# Patient Record
Sex: Male | Born: 1975 | Race: Black or African American | Hispanic: No | Marital: Married | Smoking: Former smoker
Health system: Southern US, Community
[De-identification: ages and names within clinical notes are randomized; demographics above are authoritative.]

## PROBLEM LIST (undated history)

## (undated) DIAGNOSIS — J309 Allergic rhinitis, unspecified: Secondary | ICD-10-CM

## (undated) HISTORY — PX: VASECTOMY: SHX75

## (undated) HISTORY — DX: Allergic rhinitis, unspecified: J30.9

## (undated) SURGERY — BRONCHOSCOPY, WITH FLUOROSCOPY
Anesthesia: Moderate Sedation

---

## 1998-04-30 ENCOUNTER — Emergency Department (HOSPITAL_COMMUNITY): Admission: EM | Admit: 1998-04-30 | Discharge: 1998-04-30 | Payer: Self-pay | Admitting: Emergency Medicine

## 2002-04-27 ENCOUNTER — Encounter: Payer: Self-pay | Admitting: Emergency Medicine

## 2002-04-27 ENCOUNTER — Emergency Department (HOSPITAL_COMMUNITY): Admission: EM | Admit: 2002-04-27 | Discharge: 2002-04-28 | Payer: Self-pay | Admitting: Emergency Medicine

## 2005-07-31 ENCOUNTER — Emergency Department (HOSPITAL_COMMUNITY): Admission: EM | Admit: 2005-07-31 | Discharge: 2005-07-31 | Payer: Self-pay | Admitting: Emergency Medicine

## 2006-11-27 ENCOUNTER — Encounter: Admission: RE | Admit: 2006-11-27 | Discharge: 2006-11-27 | Payer: Self-pay | Admitting: Family Medicine

## 2013-01-18 ENCOUNTER — Ambulatory Visit: Payer: Self-pay | Admitting: Family Medicine

## 2013-01-18 VITALS — BP 138/75 | HR 96 | Temp 99.0°F | Resp 20 | Ht 68.5 in | Wt 213.6 lb

## 2013-01-18 DIAGNOSIS — Z0000 Encounter for general adult medical examination without abnormal findings: Secondary | ICD-10-CM

## 2013-01-18 DIAGNOSIS — Z0289 Encounter for other administrative examinations: Secondary | ICD-10-CM

## 2013-01-18 NOTE — Progress Notes (Signed)
[facility] [address] [place] [address] [phone]  Date:  01/18/2013   Name:  Dwayne [name]   DOB:  07-30-1975   MRN:  [id number]  PCP:  No primary provider on file.    Chief Complaint: Employment Physical   History of Present Illness:  Dwayne [name] is a 37 y.o. very pleasant male patient who presents with the following:  Here today for a DOT exam.    There are no active problems to display for this patient.   History reviewed. No pertinent past medical history.  Past Surgical History  Procedure Laterality Date  . Vasectomy      2002    History  Substance Use Topics  . Smoking status: Never Smoker   . Smokeless tobacco: Not on file  . Alcohol Use: Yes    History reviewed. No pertinent family history.  Not on File  Medication list has been reviewed and updated.  No current outpatient prescriptions on file prior to visit.   No current facility-administered medications on file prior to visit.    Review of Systems:  As per HPI- otherwise negative.   Physical Examination: Filed Vitals:   01/18/13 1632  BP: 140/76  Pulse: 96  Temp: 99 F (37.2 C)  Resp: 20   Filed Vitals:   01/18/13 1632  Height: 5' 8.5" (1.74 m)  Weight: 213 lb 9.6 oz (96.888 kg)   Body mass index is 32 kg/(m^2). Ideal Body Weight: Weight in (lb) to have BMI = 25: 166.5  GEN: WDWN, NAD, Non-toxic, A & O x 3 HEENT: Atraumatic, Normocephalic. Neck supple. No masses, No LAD. Bilateral TM wnl, oropharynx normal.  PEERL,EOMI.   Ears and Nose: No external deformity. CV: RRR, No M/G/R. No JVD. No thrill. No extra heart sounds. PULM: CTA B, no wheezes, crackles, rhonchi. No retractions. No resp. distress. No accessory muscle use. ABD: S, NT, ND, +BS. No rebound. No HSM. EXTR: No c/c/e NEURO Normal gait.   Normal strength and DTR all extremities  PSYCH: Normally interactive. Conversant. Not depressed or anxious appearing.  Calm demeanor.  No  inguinal hernia  Assessment and Plan: Physical exam  2 year card.   See patient instructions for more details.     Signed Abbe Amsterdam, MD

## 2013-01-18 NOTE — Patient Instructions (Signed)
You qualify for a 2 year DOT card today.  Please follow-up your trace protein and blood in urine with your regular doctor.

## 2014-06-27 ENCOUNTER — Other Ambulatory Visit: Payer: Self-pay | Admitting: Family Medicine

## 2014-06-27 DIAGNOSIS — R945 Abnormal results of liver function studies: Principal | ICD-10-CM

## 2014-06-27 DIAGNOSIS — R7989 Other specified abnormal findings of blood chemistry: Secondary | ICD-10-CM

## 2014-07-04 ENCOUNTER — Ambulatory Visit
Admission: RE | Admit: 2014-07-04 | Discharge: 2014-07-04 | Disposition: A | Discharge status: Home / Self Care | Payer: BLUE CROSS/BLUE SHIELD | Source: Ambulatory Visit | Attending: Family Medicine | Admitting: Family Medicine

## 2014-07-04 ENCOUNTER — Other Ambulatory Visit: Payer: Self-pay

## 2014-07-04 DIAGNOSIS — R7989 Other specified abnormal findings of blood chemistry: Secondary | ICD-10-CM

## 2014-07-04 DIAGNOSIS — R945 Abnormal results of liver function studies: Principal | ICD-10-CM

## 2014-07-11 ENCOUNTER — Other Ambulatory Visit: Payer: Self-pay | Admitting: Family Medicine

## 2014-07-11 DIAGNOSIS — K8689 Other specified diseases of pancreas: Secondary | ICD-10-CM

## 2014-07-14 ENCOUNTER — Ambulatory Visit
Admission: RE | Admit: 2014-07-14 | Discharge: 2014-07-14 | Disposition: A | Discharge status: Home / Self Care | Payer: BLUE CROSS/BLUE SHIELD | Source: Ambulatory Visit | Attending: Family Medicine | Admitting: Family Medicine

## 2014-07-14 DIAGNOSIS — K8689 Other specified diseases of pancreas: Secondary | ICD-10-CM

## 2014-07-14 MED ORDER — IOPAMIDOL (ISOVUE-300) INJECTION 61%
100.0000 mL | Freq: Once | INTRAVENOUS | Status: AC | PRN
  Administered 2014-07-14: 100 mL via INTRAVENOUS

## 2014-07-30 ENCOUNTER — Other Ambulatory Visit (INDEPENDENT_AMBULATORY_CARE_PROVIDER_SITE_OTHER): Payer: BLUE CROSS/BLUE SHIELD

## 2014-07-30 ENCOUNTER — Encounter: Payer: Self-pay | Admitting: Pulmonary Disease

## 2014-07-30 ENCOUNTER — Ambulatory Visit (INDEPENDENT_AMBULATORY_CARE_PROVIDER_SITE_OTHER): Payer: BLUE CROSS/BLUE SHIELD | Admitting: Pulmonary Disease

## 2014-07-30 VITALS — BP 130/82 | HR 72 | Temp 97.2°F | Ht 69.0 in | Wt 175.0 lb

## 2014-07-30 DIAGNOSIS — R162 Hepatomegaly with splenomegaly, not elsewhere classified: Secondary | ICD-10-CM | POA: Diagnosis not present

## 2014-07-30 DIAGNOSIS — R591 Generalized enlarged lymph nodes: Secondary | ICD-10-CM | POA: Diagnosis not present

## 2014-07-30 DIAGNOSIS — D869 Sarcoidosis, unspecified: Secondary | ICD-10-CM

## 2014-07-30 LAB — BASIC METABOLIC PANEL
BUN: 10 mg/dL (ref 6–23)
CHLORIDE: 101 meq/L (ref 96–112)
CO2: 30 mEq/L (ref 19–32)
Calcium: 10.1 mg/dL (ref 8.4–10.5)
Creatinine, Ser: 0.78 mg/dL (ref 0.40–1.50)
GFR: 143.67 mL/min (ref >60.00)
GLUCOSE: 91 mg/dL (ref 70–99)
POTASSIUM: 3.8 meq/L (ref 3.5–5.1)
SODIUM: 137 meq/L (ref 135–145)

## 2014-07-30 NOTE — Progress Notes (Addendum)
Subjective:     Patient ID: Dwayne [name], male   DOB: [birth year], 39 y.o.   MRN: [id number]  HPI 39 y/o BM, referred by Dr. 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  [name] has enjoyed excellent general medical health & his only medication is Benedryl prn;  About 84mo ago he noted itching & decrease in appetite assoc w/ nausea & a 50 lb wt loss ?etiology; he was treated w/ Pred & Atarax & the itching improved; Labs showed elev LFTs w/ AlkPhos=636, GOT=68, GPT=62, Sed=55 (other Chems/ CBC/ TSH all wnl); repeat labs 2/16 were similar (plus Hep screen waqs neg) & he had AbdSonar 3/16 showing no gallstones, norm CBD, cystic lesion in pancreatic head, nonobstructing stone in right kidney; f/u CT Abd was surprising- no pancreatic lesion seen but he had mod hepatomegaly/ steatosis/ & heterogeneous enhancement (due to liver lesions), innumerable low-density splenic lesions, extensive abdominal adenopathy, bilat punctate renal collecting system calculi, and visualize lung bases w/ L>R nodularity & fibrosis & adenopathy seen...  PMHx, FamHx, SocHx are non-contributory; Married, one son, and he is a smoker- <1/2ppd, <10 pack-year history overall; and he denies cough, sput, hemoptysis, f/c/s, CP, etc...  EXAM reveals Afeb, VSS, O2sat=99% on RA at rest;  HEENT- neg, mallampati 2;  Chest- clear w/o w/r/r;  Heart- RR w/o m/r/g;  Ext- neg w/o c/c/e; no palp adenopathy...  CXR>  (he forgot to go to [facility] for this baseline film)  High- Res CT Chest w/ contrast>  DONE 08/04/14> bilat hilar & mediastinal adenopathy including 1.8cm subcarinal node; diffuse peribronchovasc nodularity w/ assoc mild consolid in LLL; heterogeneous liver and splenic nodularity...  Full PFTs w/ lung volumes and DLCO>  DONE 08/04/14> FVC=3.56 (89%), FEV1=2.87 (87%), %1sec=81, mid-flows normal at 86% predicted;  TLC=4.03 (63%), RV=?0.39 (24%)?;  DLCO=?130% predicted?   Additional  Labs w/ ACE level> Chems- wnl;  ACE>100 (8-52)...      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 Once we get to see these studies we will contact the pt regarding the best approach to Bx for tissue diagnosis, only then can we formulate a treatment plan...  ADDENDUM>>  All data collected is concordant for the diagnosis of Sarcoidosis- CT Chest shows BHA, mediastinal adenopathy, and parenchymal nodularity- making it likely that we can obtain a diagnositic Bx from lung tissue &/or mediatinal LN => we will arrange for Bronchoscopy w/ Bx...     Past Medical History  Diagnosis Date  . Allergic rhinitis    Past Surgical History  Procedure Laterality Date  . Vasectomy      2002    Outpatient Encounter Prescriptions as of 07/30/2014  Medication Sig  . diphenhydrAMINE (SOMINEX) 25 MG tablet Take 25 mg by mouth as needed for sleep.   No Known Allergies   No family history on file.> FamHx is neg for sarcoid, cancer, or hx lung dis...  History   Social History  . Marital Status: Married    Spouse Name: N/A  . Number of Children: N/A  . Years of Education: N/A   Occupational History  . Not on file.  Social History Main Topics  . Smoking status: Light Tobacco Smoker -- 0.30 packs/day for 10 years    Types: Cigarettes  . Smokeless tobacco: Not on file  . Alcohol Use: 0.0 oz/week    0 Standard drinks or equivalent per week  . Drug Use: No  . Sexual Activity: Not on file   Other Topics Concern  . Not on file   Social History Narrative    Current Medications, Allergies, Past Medical History, Past Surgical History, Family History, and Social History were reviewed  in [employer] record.   Review of Systems         All symptoms NEG except when BOLDED >>  Constitutional:  Denies F/C/S, decreased appetite & unexpected 50# weight loss. HEENT:  No HA, visual changes, earache, nasal symptoms, sore throat, hoarseness. Resp:  No cough, sputum, hemoptysis; no SOB, tightness, wheezing. Cardio:  No CP, palpit, DOE, orthopnea, edema. GI:  +Nausea w/o V/D/C or blood in stool; no reflux, abd pain, distention, or gas. GU:  No dysuria, freq, urgency, hematuria, or flank pain. MS:  Denies joint pain, swelling, tenderness, or decr ROM; no neck pain, back pain, etc. Neuro:  No tremors, seizures, dizziness, syncope, weakness, numbness, gait abn. Skin:  No suspicious lesions or skin rash, prev itching resolved... Heme:  +adenopathy, no bruising/ bleeding. Psyche: Denies confusion, sleep disturbance, hallucinations, anxiety, depression.   Objective:   Physical Exam    Vital Signs:  Reviewed...  General:  WD, WN, 39 y/o BM in NAD; alert & oriented; pleasant & cooperative... HEENT:  [place]/AT; Conjunctiva- pink, Sclera- nonicteric, EOM-wnl, PERRLA, EACs-clear, TMs-wnl; NOSE-clear; THROAT-clear & wnl. Neck:  Supple w/ full ROM; no JVD; normal carotid impulses w/o bruits; no thyromegaly or nodules palpated; no lymphadenopathy. Chest:  Clear to P & A; without wheezes, rales, or rhonchi heard. Heart:  Regular Rhythm; norm S1 & S2 without murmurs, rubs, or gallops detected. Abdomen:  Soft w/ min epig tenderness- no guarding or rebound; normal bowel sounds; liver edge palpated, no masses felt... Ext:  Normal ROM; without deformities or arthritic changes; no varicose veins, venous insuffic, or edema;  Pulses intact w/o bruits. Neuro:  CNs II-XII intact; motor testing normal; sensory testing normal; gait normal & balance OK. Derm:  No lesions noted; no rash etc. Lymph:  No cervical, supraclavicular, axillary, or inguinal adenopathy  palpated.   Assessment:      R/O Sarcoidosis to explain all his findings>> Chest evaluation pending but looks like adenopathy & reticulonodular changes in lung bases L>R... Abn LFTs (AlkPhos> GOT/GPT) w/ mod hepatomegaly and heterogenous enhancement (due to steatosis & numerous lesions)... Splenic enlargement too w/ innumerable low density lesions throughout... Extensive intra-abdominal adenopathy...  The big picture makes a compelling story for SARCOIDOSIS w/ granulomatous inflammation in liver, spleen, intra-abd nodes; and prob the chest as well- based on prox extent of the Abd CT but he appears to be asymptomatic in regards to any respiratory involvement;  We discussed the need for a CXR & hi-res CTChest, along w/ baseline PFTs and an ACE level... Once we get to see these studies we will contact the pt regarding the best approach to Bx for tissue diagnosis, only then can we formulate a treatment plan...     Plan:     Patient's Medications  New Prescriptions   No medications on file  Previous Medications   DIPHENHYDRAMINE (SOMINEX) 25 MG TABLET    Take 25 mg by mouth as needed for sleep.  Modified Medications  No medications on file  Discontinued Medications   No medications on file

## 2014-07-30 NOTE — Patient Instructions (Signed)
Dwayne [name], it was nice meeting you today...  We reviewed your data to date and the probability that this all represents the condition we call Sarcoidosis.Marland KitchenMarland Kitchen  I have suggested that we proceed w/ a CT scan of your chest and a pulmonary function test...    Along with the sarcoid blood test called the ACE level...    We can then decide on the best approach for biopsy (this is required to officially diagnose Sarcoidosis)...  I will call w/ the test results & we can decide on this approach at that time...  Call for any questions...  We will plan a follow up visit after the biopsy is done.Marland KitchenMarland Kitchen

## 2014-07-31 LAB — ANGIOTENSIN CONVERTING ENZYME: Angiotensin-Converting Enzyme: >100 U/L — ABNORMAL HIGH (ref 8–52)

## 2014-08-04 ENCOUNTER — Ambulatory Visit (INDEPENDENT_AMBULATORY_CARE_PROVIDER_SITE_OTHER)
Admission: RE | Admit: 2014-08-04 | Discharge: 2014-08-04 | Disposition: A | Discharge status: Home / Self Care | Payer: BLUE CROSS/BLUE SHIELD | Source: Ambulatory Visit | Attending: Pulmonary Disease | Admitting: Pulmonary Disease

## 2014-08-04 ENCOUNTER — Ambulatory Visit (HOSPITAL_COMMUNITY)
Admission: RE | Admit: 2014-08-04 | Discharge: 2014-08-04 | Disposition: A | Discharge status: Home / Self Care | Payer: BLUE CROSS/BLUE SHIELD | Source: Ambulatory Visit | Attending: Pulmonary Disease | Admitting: Pulmonary Disease

## 2014-08-04 DIAGNOSIS — D869 Sarcoidosis, unspecified: Secondary | ICD-10-CM | POA: Diagnosis present

## 2014-08-04 LAB — PULMONARY FUNCTION TEST
DL/VA % pred: 185 %
DL/VA: 8.3 ml/min/mmHg/L
DLCO UNC % PRED: 130 %
DLCO UNC: 37.03 ml/min/mmHg
FEF 25-75 Post: 2.91 L/sec
FEF 25-75 Pre: 3.1 L/sec
FEF2575-%Change-Post: -6 %
FEF2575-%PRED-PRE: 86 %
FEF2575-%Pred-Post: 81 %
FEV1-%CHANGE-POST: 0 %
FEV1-%PRED-POST: 86 %
FEV1-%Pred-Pre: 87 %
FEV1-PRE: 2.87 L
FEV1-Post: 2.85 L
FEV1FVC-%Change-Post: 1 %
FEV1FVC-%PRED-PRE: 98 %
FEV6-%Change-Post: -2 %
FEV6-%PRED-POST: 88 %
FEV6-%Pred-Pre: 91 %
FEV6-Post: 3.47 L
FEV6-Pre: 3.56 L
FEV6FVC-%Change-Post: 0 %
FEV6FVC-%Pred-Post: 102 %
FEV6FVC-%Pred-Pre: 102 %
FVC-%CHANGE-POST: -2 %
FVC-%PRED-POST: 86 %
FVC-%PRED-PRE: 89 %
FVC-Post: 3.47 L
FVC-Pre: 3.56 L
PRE FEV1/FVC RATIO: 81 %
PRE FEV6/FVC RATIO: 100 %
Post FEV1/FVC ratio: 82 %
Post FEV6/FVC ratio: 100 %
RV % PRED: 24 %
RV: 0.39 L
TLC % pred: 63 %
TLC: 4.03 L

## 2014-08-04 MED ORDER — ALBUTEROL SULFATE (2.5 MG/3ML) 0.083% IN NEBU
2.5000 mg | INHALATION_SOLUTION | Freq: Once | RESPIRATORY_TRACT | Status: AC
  Administered 2014-08-04: 2.5 mg via RESPIRATORY_TRACT

## 2014-08-06 ENCOUNTER — Telehealth: Payer: Self-pay | Admitting: Pulmonary Disease

## 2014-08-07 NOTE — Telephone Encounter (Signed)
SN spoke with pt about results and scheduling a broncoscopy with one of our providers. Per SN, pt voiced understanding. Nothing further is needed at this time.

## 2014-08-07 NOTE — Telephone Encounter (Signed)
Message printed and placed on  SN cart for review  

## 2014-08-07 NOTE — Telephone Encounter (Signed)
[phone], pt wife calling back states SN called her this morning and left a message to have her call back to discuss scheduling bronch/biopsy

## 2014-08-08 ENCOUNTER — Telehealth: Payer: Self-pay | Admitting: Pulmonary Disease

## 2014-08-08 NOTE — Telephone Encounter (Signed)
Bronch scheduled for Wednesday, May 4 at 8:00 at [facility] with BQ performing the bronch.  Spoke with pt's wife who states this time works well for pt.  Forwarding to BQ and SN to make them aware.  Nothing further needed.

## 2014-08-13 ENCOUNTER — Inpatient Hospital Stay (HOSPITAL_COMMUNITY)
Admission: RE | Admit: 2014-08-13 | Discharge: 2014-08-13 | Disposition: A | Discharge status: Home / Self Care | Payer: BLUE CROSS/BLUE SHIELD | Source: Ambulatory Visit

## 2014-08-13 ENCOUNTER — Ambulatory Visit (HOSPITAL_COMMUNITY)
Admission: RE | Admit: 2014-08-13 | Discharge: 2014-08-13 | Disposition: A | Discharge status: Home / Self Care | Payer: BLUE CROSS/BLUE SHIELD | Source: Ambulatory Visit | Attending: Pulmonary Disease | Admitting: Pulmonary Disease

## 2014-08-13 ENCOUNTER — Ambulatory Visit (HOSPITAL_COMMUNITY): Payer: BLUE CROSS/BLUE SHIELD

## 2014-08-13 ENCOUNTER — Encounter (HOSPITAL_COMMUNITY)
Admission: RE | Disposition: A | Discharge status: Home / Self Care | Payer: Self-pay | Source: Ambulatory Visit | Attending: Pulmonary Disease

## 2014-08-13 DIAGNOSIS — F17210 Nicotine dependence, cigarettes, uncomplicated: Secondary | ICD-10-CM | POA: Diagnosis not present

## 2014-08-13 DIAGNOSIS — R938 Abnormal findings on diagnostic imaging of other specified body structures: Secondary | ICD-10-CM | POA: Diagnosis not present

## 2014-08-13 DIAGNOSIS — D869 Sarcoidosis, unspecified: Secondary | ICD-10-CM | POA: Insufficient documentation

## 2014-08-13 DIAGNOSIS — R9389 Abnormal findings on diagnostic imaging of other specified body structures: Secondary | ICD-10-CM | POA: Insufficient documentation

## 2014-08-13 DIAGNOSIS — Z9889 Other specified postprocedural states: Secondary | ICD-10-CM

## 2014-08-13 HISTORY — PX: VIDEO BRONCHOSCOPY: SHX5072

## 2014-08-13 LAB — CBC
HCT: 44.1 % (ref 39.0–52.0)
Hemoglobin: 14.8 g/dL (ref 13.0–17.0)
MCH: 29.5 pg (ref 26.0–34.0)
MCHC: 33.6 g/dL (ref 30.0–36.0)
MCV: 87.8 fL (ref 78.0–100.0)
Platelets: 306 10*3/uL (ref 150–400)
RBC: 5.02 MIL/uL (ref 4.22–5.81)
RDW: 13.7 % (ref 11.5–15.5)
WBC: 5.2 10*3/uL (ref 4.0–10.5)

## 2014-08-13 SURGERY — BRONCHOSCOPY, WITH FLUOROSCOPY
Anesthesia: Moderate Sedation | Laterality: Bilateral

## 2014-08-13 MED ORDER — MIDAZOLAM HCL 5 MG/ML IJ SOLN
INTRAMUSCULAR | Status: AC
  Filled 2014-08-13: qty 2

## 2014-08-13 MED ORDER — BUTAMBEN-TETRACAINE-BENZOCAINE 2-2-14 % EX AERO: 1.0000 | INHALATION_SPRAY | Freq: Once | CUTANEOUS | Status: DC

## 2014-08-13 MED ORDER — PHENYLEPHRINE HCL 0.25 % NA SOLN
NASAL | Status: DC | PRN
  Administered 2014-08-13: 2 via NASAL

## 2014-08-13 MED ORDER — FENTANYL CITRATE (PF) 100 MCG/2ML IJ SOLN
INTRAMUSCULAR | Status: AC
  Filled 2014-08-13: qty 4

## 2014-08-13 MED ORDER — LIDOCAINE HCL 2 % EX GEL: 1.0000 "application " | Freq: Once | CUTANEOUS | Status: DC

## 2014-08-13 MED ORDER — MIDAZOLAM HCL 5 MG/ML IJ SOLN
INTRAMUSCULAR | Status: DC | PRN
  Administered 2014-08-13: 2 mg via INTRAVENOUS
  Administered 2014-08-13 (×2): 1 mg via INTRAVENOUS

## 2014-08-13 MED ORDER — FENTANYL CITRATE (PF) 100 MCG/2ML IJ SOLN
25.0000 ug | Freq: Once | INTRAMUSCULAR | Status: AC
  Administered 2014-08-13: 25 ug via INTRAVENOUS

## 2014-08-13 MED ORDER — LIDOCAINE HCL (PF) 1 % IJ SOLN
INTRAMUSCULAR | Status: DC | PRN
  Administered 2014-08-13: 6 mL

## 2014-08-13 MED ORDER — MIDAZOLAM HCL 2 MG/2ML IJ SOLN: 4.0000 mg | Freq: Once | INTRAMUSCULAR | Status: DC

## 2014-08-13 MED ORDER — FENTANYL CITRATE (PF) 100 MCG/2ML IJ SOLN
100.0000 ug | Freq: Once | INTRAMUSCULAR | Status: AC
  Administered 2014-08-13: 50 ug via INTRAVENOUS

## 2014-08-13 MED ORDER — PHENYLEPHRINE HCL 0.25 % NA SOLN: 1.0000 | Freq: Four times a day (QID) | NASAL | Status: DC | PRN

## 2014-08-13 MED ORDER — LIDOCAINE HCL 2 % EX GEL
CUTANEOUS | Status: DC | PRN
  Administered 2014-08-13: 1

## 2014-08-13 NOTE — H&P (Signed)
  [facility]  HPI: Mr. Melucci presented with a significant weight loss this year and was found to have an abnormal CT chest with mediastinal lymphadenopathy diffuse nodularity.  He also has a high serum ACE level.  He was referred by my partner for a bronchoscopy. He is feeling well today  Past Medical History  Diagnosis Date  . Allergic rhinitis      No family history on file.   History   Social History  . Marital Status: Married    Spouse Name: N/A  . Number of Children: N/A  . Years of Education: N/A   Occupational History  . Not on file.   Social History Main Topics  . Smoking status: Light Tobacco Smoker -- 0.30 packs/day for 10 years    Types: Cigarettes  . Smokeless tobacco: Not on file  . Alcohol Use: 0.0 oz/week    0 Standard drinks or equivalent per week  . Drug Use: No  . Sexual Activity: Not on file   Other Topics Concern  . Not on file   Social History Narrative     No Known Allergies   @[email or web address] @  Filed Vitals:   08/13/14 0730 08/13/14 0735 08/13/14 0740 08/13/14 0745  BP: 135/93 139/82 143/89 148/85  Pulse: 70 65 68 69  Temp:      TempSrc:      Resp: 13 11 11 11   SpO2: 99% 98% 99% 98%   Gen: well appearing, no acute distress HENT: NCAT, OP clear, neck supple without masses Eyes: EOMi, sclera anicteric Lymph: no cervical lymphadenopathy PULM: CTA B CV: RRR, no mgr, no JVD GI: BS+, soft, nontender, no hsm Derm: no rash or skin breakdown MSK: normal bulk and tone Neuro: A&Ox4, MAEW Psyche: normal mood and affect  CT images personally reviewed with significant nodularity throughout both lungs, particularly on the left lung  Impression: 1) Abnormal CT chest which is likely representative of sarcoidosis, needs tissue diagnosis.  Plan 1) CBC stat to check PLT 2) bronchoscopy with fluoro today for biopsy  Roselie Awkward, MD [place] [facility] Pager: [phone] Cell: [phone] If no response, call [phone]

## 2014-08-13 NOTE — Discharge Instructions (Signed)
Flexible Bronchoscopy, Care After These instructions give you information on caring for yourself after your procedure. Your doctor may also give you more specific instructions. Call your doctor if you have any problems or questions after your procedure. HOME CARE  Do not eat or drink anything for 2 hours after your procedure. If you try to eat or drink before the medicine wears off, food or drink could go into your lungs. You could also burn yourself.  After 2 hours have passed and when you can cough and gag normally, you may eat soft food and drink liquids slowly.  The day after the test, you may eat your normal diet.  You may do your normal activities.  Keep all doctor visits. GET HELP RIGHT AWAY IF:  You get more and more short of breath.  You get light-headed.  You feel like you are going to pass out (faint).  You have chest pain.  You have new problems that worry you.  You cough up more than a little blood.  You cough up more blood than before. MAKE SURE YOU:  Understand these instructions.  Will watch your condition.  Will get help right away if you are not doing well or get worse. DO NOT EAT OR DRINK UNTIL 10:30 AM 08/13/14  Document Released: 01/23/2009 Document Revised: 04/02/2013 Document Reviewed: 11/30/2012 [facility] Patient Information 2015 [place], [place]. This information is not intended to replace advice given to you by your health care provider. Make sure you discuss any questions you have with your health care provider.

## 2014-08-13 NOTE — Progress Notes (Signed)
Video Bronchoscopy Performed  Intervention Bronchial Washing done Intervention Biopsy done  Pt tolerated well

## 2014-08-13 NOTE — Op Note (Signed)
PCCM Bronchoscopy Procedure Note  The patient was informed of the risks (including but not limited to bleeding, infection, respiratory failure, lung injury, tooth/oral injury) and benefits of the procedure and gave consent, see chart.  Indication: Abnormal CT chest, likely sarcoid  Location: [facility]  Condition pre procedure: Stable  Medications for procedure: Versed 4mg  IV, Fentanyl 168mcg IV, topical lidocaine 1% 9cc  Procedure description: The bronchoscope was introduced through the nose and passed to the bilateral lungs to the level of the subsegmental bronchi throughout the tracheobronchial tree.  Airway exam revealed normal appearing and functioning larynx, normal trachea, sharp carina.  The tracheobronchial trees bilaterally were normal with the exception of a scant amount of white thin mucus.  No airway lesions or mass.  Procedures performed:  1) Transbronchial biopsy left lower lobe lateral and anterior basal segments> 6 total biopsies 2) Endobronchial biopsy left lung subcarina 3) BAL Lingula   Specimens sent:  1) Surgical pathology left lower lobe transbronchial biopsy 2) Surgical pathology left subcarina endobronchial biopsy 3) Routine cytology BAL 4) Culture (Bacterial, fungal, AFB) BAL  Condition post procedure: Stable  EBL: < 07EM  Complications: none immediate  A chest X-ray was ordered at the completion of the procedure  Post procedure diagnosis: normal bronchoscopy, abnormal CT chest suspicious for sarcoidosis  Roselie Awkward, MD [place] [facility] Pager: [phone] Cell: [phone] If no response, call [phone]

## 2014-08-14 ENCOUNTER — Telehealth: Payer: Self-pay | Admitting: Pulmonary Disease

## 2014-08-14 ENCOUNTER — Encounter (HOSPITAL_COMMUNITY): Payer: Self-pay | Admitting: Pulmonary Disease

## 2014-08-14 NOTE — Telephone Encounter (Signed)
Per 08/14/14 phone note: Dwayne Doom, MD at 08/14/2014 1:44 PM     Status: Signed       Expand All Collapse All   Attempted to call patient to discuss results of bronchoscopy, had to leave a message. I asked him to call back and let us know when he can be reached.      --   Called # above provided for pt spouse [name]. She reports we can call her with results anytime as she is a Marine scientist and would understand per spouse.  Pt can be reached at [phone] anytime. Please advise BQ thanks

## 2014-08-14 NOTE — Telephone Encounter (Signed)
Attempted to call patient to discuss results of bronchoscopy, had to leave a message.  I asked him to call back and let us know when he can be reached.

## 2014-08-15 ENCOUNTER — Other Ambulatory Visit: Payer: Self-pay

## 2014-08-15 LAB — CULTURE, RESPIRATORY: SPECIAL REQUESTS: NORMAL

## 2014-08-15 LAB — CULTURE, RESPIRATORY W GRAM STAIN

## 2014-08-15 NOTE — Telephone Encounter (Signed)
I spoke with Dwayne [name] and explained the results were negative and we will wait for the results of the cultures which can take up to 6 weeks.

## 2014-08-19 ENCOUNTER — Other Ambulatory Visit: Payer: Self-pay

## 2014-08-19 ENCOUNTER — Telehealth: Payer: Self-pay | Admitting: Pulmonary Disease

## 2014-08-19 DIAGNOSIS — R9389 Abnormal findings on diagnostic imaging of other specified body structures: Secondary | ICD-10-CM

## 2014-08-19 NOTE — Telephone Encounter (Signed)
Called and spoke to pt's wife, [name]. [name] stated on 5/6 BQ spoke with pt and not her. Informed her of the results per BQ. [name] verbalized understanding and denied any further questions or concerns at this time.    Juanito Doom, MD at 08/15/2014 7:23 AM     Status: Signed       Expand All Collapse All   I spoke with [name] and explained the results were negative and we will wait for the results of the cultures which can take up to 6 weeks.

## 2014-08-21 ENCOUNTER — Telehealth: Payer: Self-pay

## 2014-08-21 ENCOUNTER — Encounter (HOSPITAL_COMMUNITY): Payer: Self-pay | Admitting: Pulmonary Disease

## 2014-08-21 NOTE — Telephone Encounter (Signed)
-----   Message from Juanito Doom, MD sent at 08/21/2014  5:18 AM EDT ----- Actually its not a great day for me because I have to take my son to a Dr's appointment.  Caryl Pina, can you double book him in clinic with me that week so he and I can go over his biopsy results?  Thanks Ruby Cola ----- Message -----    From: Joellen Jersey    Sent: 08/20/2014  11:02 AM      To: Juanito Doom, MD  [email or web address]  09/01/14@7 :30am is this ok if so i will call the pt thanks libby

## 2014-08-21 NOTE — Telephone Encounter (Signed)
LMTCB X1 for pt.  Pt needs to be double booked on Thursday, May 26th with BQ if possible.  Please let pt know that BQ wishes to discuss biopsy results and further course of treatment in person.

## 2014-08-25 NOTE — Telephone Encounter (Signed)
Pt double booked for next Thursday at [facility] request.  Nothing further needed at this time.

## 2014-09-01 ENCOUNTER — Encounter (HOSPITAL_COMMUNITY): Admission: RE | Payer: Self-pay | Source: Ambulatory Visit

## 2014-09-01 ENCOUNTER — Encounter (HOSPITAL_COMMUNITY): Payer: BLUE CROSS/BLUE SHIELD

## 2014-09-01 ENCOUNTER — Ambulatory Visit (HOSPITAL_COMMUNITY)
Admission: RE | Admit: 2014-09-01 | Payer: BLUE CROSS/BLUE SHIELD | Source: Ambulatory Visit | Admitting: Pulmonary Disease

## 2014-09-01 SURGERY — ENDOBRONCHIAL ULTRASOUND (EBUS)
Anesthesia: General | Laterality: Bilateral

## 2014-09-04 ENCOUNTER — Ambulatory Visit (INDEPENDENT_AMBULATORY_CARE_PROVIDER_SITE_OTHER): Payer: BLUE CROSS/BLUE SHIELD | Admitting: Pulmonary Disease

## 2014-09-04 ENCOUNTER — Encounter: Payer: Self-pay | Admitting: Pulmonary Disease

## 2014-09-04 VITALS — BP 118/72 | HR 69 | Ht 69.0 in | Wt 179.0 lb

## 2014-09-04 DIAGNOSIS — R9389 Abnormal findings on diagnostic imaging of other specified body structures: Secondary | ICD-10-CM

## 2014-09-04 DIAGNOSIS — R938 Abnormal findings on diagnostic imaging of other specified body structures: Secondary | ICD-10-CM | POA: Diagnosis not present

## 2014-09-04 NOTE — Addendum Note (Signed)
Addended by: Parke Poisson E on: 09/04/2014 02:07 PM   Modules accepted: Orders

## 2014-09-04 NOTE — Assessment & Plan Note (Signed)
I agree with my colleague that the most likely diagnosis here is sarcoidosis. However, samples from his bronchoscopy did not show evidence of granulomatous inflammation on 2 separate biopsies. We have reviewed the CT images today in clinic and I discussed the options of performing an endobronchial ultrasound-guided needle aspiration of the lymph nodes or performing a mediastinoscopy which would be the gold standard for diagnosis. Because he is our to gone through one endoscopy he would prefer to go for the definitive procedure to get an answer. I agree with this plan.  Plan: We will make arrangements for him to see thoracic surgery.  All questions were answered for both he and his wife in a greater than 25 minute visit today.

## 2014-09-04 NOTE — Patient Instructions (Addendum)
We will cancel the procedure on June 6 and will refer you to thoracic surgery for a mediastinal lymph node biopsy Follow-up 4 weeks with Dr. Lenna Gilford

## 2014-09-04 NOTE — Progress Notes (Signed)
   Subjective:    Patient ID: Dwayne [name], male    DOB: 1976/02/24, 39 y.o.   MRN: [id number]  HPI Chief Complaint  Patient presents with  . Follow-up    Pt has no complaints today.  here to review bronch and discuss possible biopsy.      This is a very pleasant 39 year old male who comes to my clinic today to follow-up after recent bronchoscopy. Briefly. He was seen by my partner a few weeks back after a CT scan showed evidence of diffuse nodular opacities in his lungs as well as some hilar lymphadenopathy. He was also noted to have splenic lesions as well as some liver lesions. He underwent a bronchoscopy with transbronchial biopsies of the left upper lobe and left lower lobe as well as BAL for culture. This showed a 9 tissue without evidence of malignancy or atypia. He says that at the time that he was seen by my partner he was having symptoms of chest tightness, shortness of breath and cough. All of the symptoms have Society that he is now feeling well.  Past Medical History  Diagnosis Date  . Allergic rhinitis       Review of Systems  Constitutional: Negative for fever, chills and fatigue.  HENT: Negative for postnasal drip, rhinorrhea and sinus pressure.   Respiratory: Negative for cough, shortness of breath and wheezing.   Cardiovascular: Negative for chest pain, palpitations and leg swelling.       Objective:   Physical Exam Filed Vitals:   09/04/14 1326  BP: 118/72  Pulse: 69  Height: 5\' 9"  (1.753 m)  Weight: 179 lb (81.194 kg)  SpO2: 99%   Gen: well appearing HENT: OP clear, TM's clear, neck supple PULM: CTA B, normal percussion CV: RRR, no mgr, trace edema GI: BS+, soft, nontender Derm: no cyanosis or rash Psyche: normal mood and affect  CT images from his CT abdomen as well as a CT chest were reviewed today in clinic, there is diffuse micronodular changes as well as some hilar adenopathy Previous consult note from my partner reviewed       Assessment  & Plan:  Abnormal CT scan, chest I agree with my colleague that the most likely diagnosis here is sarcoidosis. However, samples from his bronchoscopy did not show evidence of granulomatous inflammation on 2 separate biopsies. We have reviewed the CT images today in clinic and I discussed the options of performing an endobronchial ultrasound-guided needle aspiration of the lymph nodes or performing a mediastinoscopy which would be the gold standard for diagnosis. Because he is our to gone through one endoscopy he would prefer to go for the definitive procedure to get an answer. I agree with this plan.  Plan: We will make arrangements for him to see thoracic surgery.  All questions were answered for both he and his wife in a greater than 25 minute visit today.    No current outpatient prescriptions on file.

## 2014-09-10 LAB — FUNGUS CULTURE W SMEAR
FUNGAL SMEAR: NONE SEEN
Special Requests: NORMAL

## 2014-09-15 ENCOUNTER — Encounter (HOSPITAL_COMMUNITY): Payer: BLUE CROSS/BLUE SHIELD

## 2014-09-15 ENCOUNTER — Encounter (HOSPITAL_COMMUNITY): Admission: RE | Payer: Self-pay | Source: Ambulatory Visit

## 2014-09-15 ENCOUNTER — Ambulatory Visit (HOSPITAL_COMMUNITY)
Admission: RE | Admit: 2014-09-15 | Payer: BLUE CROSS/BLUE SHIELD | Source: Ambulatory Visit | Admitting: Pulmonary Disease

## 2014-09-15 SURGERY — ENDOBRONCHIAL ULTRASOUND (EBUS)
Anesthesia: General | Laterality: Bilateral

## 2014-09-17 ENCOUNTER — Encounter: Payer: BLUE CROSS/BLUE SHIELD | Admitting: Surgery

## 2014-09-19 ENCOUNTER — Institutional Professional Consult (permissible substitution) (INDEPENDENT_AMBULATORY_CARE_PROVIDER_SITE_OTHER): Payer: BLUE CROSS/BLUE SHIELD | Admitting: Surgery

## 2014-09-19 ENCOUNTER — Encounter: Payer: Self-pay | Admitting: Surgery

## 2014-09-19 VITALS — BP 124/75 | HR 84 | Resp 20 | Ht 69.0 in | Wt 175.0 lb

## 2014-09-19 DIAGNOSIS — R599 Enlarged lymph nodes, unspecified: Secondary | ICD-10-CM

## 2014-09-19 DIAGNOSIS — R590 Localized enlarged lymph nodes: Secondary | ICD-10-CM

## 2014-09-22 ENCOUNTER — Encounter: Payer: Self-pay | Admitting: Surgery

## 2014-09-22 ENCOUNTER — Other Ambulatory Visit: Payer: Self-pay | Admitting: *Deleted

## 2014-09-22 DIAGNOSIS — R590 Localized enlarged lymph nodes: Secondary | ICD-10-CM

## 2014-09-22 NOTE — Progress Notes (Signed)
PCP is Donnie Coffin, MD Referring Provider is Juanito Doom, MD  Chief Complaint  Patient presents with  . Adenopathy    Surgcial eval, Chest CT  08/04/14, PCCM Bronchoscopy Procedure 08/13/14    HPI:  The patient is a 39 year old [name] who presented with a several  month history of weight loss, decreased appetite, night sweats and itching. Labs showed elevated LFTs with Alk Phos of 636, SGOT 68 and SGPT of 62 with Sed rate of 55. Abdominal US showed a cystic lesion in the pancreatic head. Abdominal CT showed abdominal adenopathy, innumerable splenic and hepatic lesions and pulmonary peribronchovascular nodularity suggesting sarcoid. There was no pancreatic lesion. Hi res chest CT showed bilateral hilar and mediastinal adenopathy with perilymphatic distribution of nodularity throughout the lungs, most indicative of sarcoid. He underwent bronchoscopy with biopsy of the LLL and all biopsies showed no malignancy and no clear findings of sarcoid.  Past Medical History  Diagnosis Date  . Allergic rhinitis     Past Surgical History  Procedure Laterality Date  . Vasectomy      2002  . Video bronchoscopy Bilateral 08/13/2014    Procedure: VIDEO BRONCHOSCOPY WITH FLUORO;  Surgeon: Juanito Doom, MD;  Location: [facility];  Service: Cardiopulmonary;  Laterality: Bilateral;    Family History  Problem Relation Age of Onset  . COPD Mother     Social History History  Substance Use Topics  . Smoking status: Former Smoker -- 0.30 packs/day for Dwayne years    Types: Cigarettes    Quit date: 08/05/2014  . Smokeless tobacco: Never Used  . Alcohol Use: 0.0 oz/week    0 Standard drinks or equivalent per week    No current outpatient prescriptions on file.   No current facility-administered medications for this visit.    No Known Allergies  Review of Systems  Constitutional: Positive for appetite change and unexpected weight change. Negative for fever, chills, activity  change and fatigue.  Eyes: Negative.   Respiratory: Positive for cough. Negative for shortness of breath.   Cardiovascular: Negative for chest pain, palpitations and leg swelling.  Gastrointestinal: Negative.   Endocrine: Negative.   Genitourinary: Negative.   Musculoskeletal: Negative.   Skin: Negative.   Neurological: Negative.   Hematological: Negative.   Psychiatric/Behavioral: Negative.     BP 124/75 mmHg  Pulse 84  Resp Dwayne  Ht $R'5\' 9"'eR$  (1.753 m)  Wt 175 lb (79.379 kg)  BMI 25.83 kg/m2  SpO2 97% Physical Exam   Diagnostic Tests:  CLINICAL DATA: Abnormal finding on recent CT abdomen 07/14/2014, question sarcoid, initial encounter.  EXAM: CT CHEST WITHOUT CONTRAST  TECHNIQUE: Multidetector CT imaging of the chest was performed following the standard protocol without intravenous contrast. High resolution imaging of the lungs, as well as inspiratory and expiratory imaging, was performed.  COMPARISON: CT abdomen 07/14/2014.  FINDINGS: Mediastinum/Nodes: Mediastinal lymph nodes measure up to 8 mm in the AP window. Subcarinal lymph node measures 1.8 cm. Hilar regions are difficult to definitively evaluate without IV contrast but appear somewhat bulky, suggesting adenopathy. Axillary lymph nodes are subcentimeter in short axis size. Heart size normal. No pericardial effusion. Pre pericardiac lymph nodes are not enlarged by CT size criteria.  Lungs/Pleura: Diffuse peribronchovascular nodularity with involvement of the fissures. Associated mild consolidation in the left lower lobe. No pleural fluid. Airway is unremarkable.  Upper abdomen: Visualized portion of the liver is somewhat heterogeneous and better visualized on 07/14/2014. Visualized portions of the adrenal  glands are unremarkable. Splenic nodularity also better visualized on 07/14/2014. Gastrohepatic ligament lymph nodes measure up to 12 mm.  Musculoskeletal: No worrisome lytic or sclerotic  lesions.  IMPRESSION: 1. Mediastinal and presumed bi hilar adenopathy, with perilymphatic distribution of nodularity throughout the lungs, most indicative of sarcoid. While confluent areas of consolidation in the left lower lobe are also felt to be within the scope of sarcoid, a superimposed infectious or inflammatory process cannot be definitively excluded. 2. 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   Electronically Signed  By: Lorin Picket M.D.  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 Transbronchial biopsies of the LLL were non-diagnostic. I think it would be best to do mediastinoscopy with lymph node biopsies and repeat transbronchial biopsies under general anesthesia. I discussed the operative procedure with the patient including alternatives, benefits and risks including but not limited to bleeding, infection, injury to mediastinal structures, pneumothorax and inability to make a firm diagnosis. He understands and agrees to proceed.  Plan:  He will be scheduled for bronchoscopy with transbronchial biopsies and mediastinoscopy with lymph node biopsies on Thursday 09/25/2014.   Gaye Pollack, MD [facility] [phone]

## 2014-09-24 ENCOUNTER — Encounter (HOSPITAL_COMMUNITY): Payer: Self-pay

## 2014-09-24 ENCOUNTER — Encounter (HOSPITAL_COMMUNITY)
Admission: RE | Admit: 2014-09-24 | Discharge: 2014-09-24 | Disposition: A | Discharge status: Home / Self Care | Payer: BLUE CROSS/BLUE SHIELD | Source: Ambulatory Visit | Attending: Surgery | Admitting: Surgery

## 2014-09-24 ENCOUNTER — Ambulatory Visit (HOSPITAL_COMMUNITY)
Admission: RE | Admit: 2014-09-24 | Discharge: 2014-09-24 | Disposition: A | Discharge status: Home / Self Care | Payer: BLUE CROSS/BLUE SHIELD | Source: Ambulatory Visit | Attending: Surgery | Admitting: Surgery

## 2014-09-24 VITALS — BP 119/66 | HR 95 | Temp 98.5°F | Resp 20 | Ht 69.0 in | Wt 183.0 lb

## 2014-09-24 DIAGNOSIS — D1432 Benign neoplasm of left bronchus and lung: Secondary | ICD-10-CM | POA: Diagnosis not present

## 2014-09-24 DIAGNOSIS — D869 Sarcoidosis, unspecified: Secondary | ICD-10-CM | POA: Diagnosis not present

## 2014-09-24 DIAGNOSIS — R590 Localized enlarged lymph nodes: Secondary | ICD-10-CM | POA: Diagnosis present

## 2014-09-24 DIAGNOSIS — Z87891 Personal history of nicotine dependence: Secondary | ICD-10-CM | POA: Diagnosis not present

## 2014-09-24 LAB — COMPREHENSIVE METABOLIC PANEL
ALT: 74 U/L — AB (ref 17–63)
AST: 66 U/L — AB (ref 15–41)
Albumin: 3.4 g/dL — ABNORMAL LOW (ref 3.5–5.0)
Alkaline Phosphatase: 445 U/L — ABNORMAL HIGH (ref 38–126)
Anion gap: 8 (ref 5–15)
BUN: 11 mg/dL (ref 6–20)
CO2: 28 mmol/L (ref 22–32)
Calcium: 9.5 mg/dL (ref 8.9–10.3)
Chloride: 106 mmol/L (ref 101–111)
Creatinine, Ser: 0.84 mg/dL (ref 0.61–1.24)
GFR calc Af Amer: >60 mL/min (ref >60)
Glucose, Bld: 84 mg/dL (ref 65–99)
POTASSIUM: 3.6 mmol/L (ref 3.5–5.1)
SODIUM: 142 mmol/L (ref 135–145)
Total Bilirubin: 0.9 mg/dL (ref 0.3–1.2)
Total Protein: 8.4 g/dL — ABNORMAL HIGH (ref 6.5–8.1)

## 2014-09-24 LAB — ABO/RH: ABO/RH(D): O POS

## 2014-09-24 LAB — PROTIME-INR
INR: 1.08 (ref 0.00–1.49)
PROTHROMBIN TIME: 14.2 s (ref 11.6–15.2)

## 2014-09-24 LAB — TYPE AND SCREEN
ABO/RH(D): O POS
ANTIBODY SCREEN: NEGATIVE

## 2014-09-24 LAB — CBC
HCT: 44.1 % (ref 39.0–52.0)
HEMOGLOBIN: 14.8 g/dL (ref 13.0–17.0)
MCH: 29.2 pg (ref 26.0–34.0)
MCHC: 33.6 g/dL (ref 30.0–36.0)
MCV: 87.2 fL (ref 78.0–100.0)
Platelets: 307 10*3/uL (ref 150–400)
RBC: 5.06 MIL/uL (ref 4.22–5.81)
RDW: 13.9 % (ref 11.5–15.5)
WBC: 4.2 10*3/uL (ref 4.0–10.5)

## 2014-09-24 LAB — APTT: aPTT: 32 seconds (ref 24–37)

## 2014-09-24 LAB — SURGICAL PCR SCREEN
MRSA, PCR: NEGATIVE
Staphylococcus aureus: NEGATIVE

## 2014-09-24 MED ORDER — CHLORHEXIDINE GLUCONATE 4 % EX LIQD: 1.0000 "application " | Freq: Once | CUTANEOUS | Status: DC

## 2014-09-24 NOTE — Pre-Procedure Instructions (Signed)
    Dwayne [name]  09/24/2014      [id number] [address], [place]  [address] Phone: [phone] Fax: [phone]    Your procedure is scheduled on September 25, 2014.  Report to [facility] Admitting at 5:30 A.M.  Call this number if you have problems the morning of surgery:  [phone]   Remember:  Do not eat food or drink liquids after midnight.  Take these medicines the morning of surgery with A SIP OF WATER    Do not wear jewelry.  Do not wear lotions, powders, or colognes.  You may NOT wear deodorant.  Men may shave face and neck.  Do not bring valuables to the hospital.  [facility] is not responsible for any belongings or valuables.  Contacts, dentures or bridgework may not be worn into surgery.  Leave your suitcase in the car.  After surgery it may be brought to your room.  For patients admitted to the hospital, discharge time will be determined by your treatment team.  Patients discharged the day of surgery will not be allowed to drive home.   Name and phone number of your driver:    Special instructions:  "[place]"  Please read over the following fact sheets that you were given. Pain Booklet, Coughing and Deep Breathing, Blood Transfusion Information and Surgical Site Infection Prevention

## 2014-09-25 ENCOUNTER — Ambulatory Visit (HOSPITAL_COMMUNITY)
Admission: RE | Admit: 2014-09-25 | Discharge: 2014-09-25 | Disposition: A | Discharge status: Home / Self Care | Payer: BLUE CROSS/BLUE SHIELD | Source: Ambulatory Visit | Attending: Surgery | Admitting: Surgery

## 2014-09-25 ENCOUNTER — Encounter (HOSPITAL_COMMUNITY): Payer: Self-pay | Admitting: *Deleted

## 2014-09-25 ENCOUNTER — Ambulatory Visit (HOSPITAL_COMMUNITY): Payer: BLUE CROSS/BLUE SHIELD

## 2014-09-25 ENCOUNTER — Ambulatory Visit (HOSPITAL_COMMUNITY): Payer: BLUE CROSS/BLUE SHIELD | Admitting: Anesthesiology

## 2014-09-25 ENCOUNTER — Encounter (HOSPITAL_COMMUNITY)
Admission: RE | Disposition: A | Discharge status: Home / Self Care | Payer: Self-pay | Source: Ambulatory Visit | Attending: Surgery

## 2014-09-25 DIAGNOSIS — D1432 Benign neoplasm of left bronchus and lung: Secondary | ICD-10-CM | POA: Diagnosis not present

## 2014-09-25 DIAGNOSIS — Z87891 Personal history of nicotine dependence: Secondary | ICD-10-CM | POA: Insufficient documentation

## 2014-09-25 DIAGNOSIS — R590 Localized enlarged lymph nodes: Secondary | ICD-10-CM | POA: Diagnosis not present

## 2014-09-25 DIAGNOSIS — Z9889 Other specified postprocedural states: Secondary | ICD-10-CM

## 2014-09-25 DIAGNOSIS — D869 Sarcoidosis, unspecified: Secondary | ICD-10-CM | POA: Insufficient documentation

## 2014-09-25 HISTORY — PX: FLEXIBLE BRONCHOSCOPY: SHX5094

## 2014-09-25 HISTORY — PX: MEDIASTINOSCOPY: SHX5086

## 2014-09-25 LAB — AFB CULTURE WITH SMEAR (NOT AT ARMC)
ACID FAST SMEAR: NONE SEEN
Special Requests: NORMAL

## 2014-09-25 SURGERY — BRONCHOSCOPY, FLEXIBLE
Anesthesia: General | Site: Mouth

## 2014-09-25 MED ORDER — SODIUM CHLORIDE 0.9 % IJ SOLN
INTRAMUSCULAR | Status: AC
  Filled 2014-09-25: qty 10

## 2014-09-25 MED ORDER — PROPOFOL 10 MG/ML IV BOLUS
INTRAVENOUS | Status: AC
  Filled 2014-09-25: qty 20

## 2014-09-25 MED ORDER — DEXTROSE 5 % IV SOLN
1.5000 g | INTRAVENOUS | Status: AC
  Administered 2014-09-25: 1.5 g via INTRAVENOUS

## 2014-09-25 MED ORDER — GLYCOPYRROLATE 0.2 MG/ML IJ SOLN
INTRAMUSCULAR | Status: AC
  Filled 2014-09-25: qty 2

## 2014-09-25 MED ORDER — OXYCODONE HCL 5 MG PO TABS: 5.0000 mg | ORAL_TABLET | Freq: Once | ORAL | Status: DC | PRN

## 2014-09-25 MED ORDER — ONDANSETRON HCL 4 MG/2ML IJ SOLN
INTRAMUSCULAR | Status: DC | PRN
  Administered 2014-09-25: 4 mg via INTRAVENOUS

## 2014-09-25 MED ORDER — NEOSTIGMINE METHYLSULFATE 10 MG/10ML IV SOLN
INTRAVENOUS | Status: DC | PRN
  Administered 2014-09-25: 3 mg via INTRAVENOUS

## 2014-09-25 MED ORDER — GLYCOPYRROLATE 0.2 MG/ML IJ SOLN
INTRAMUSCULAR | Status: DC | PRN
  Administered 2014-09-25: 0.4 mg via INTRAVENOUS

## 2014-09-25 MED ORDER — OXYCODONE HCL 5 MG/5ML PO SOLN: 5.0000 mg | Freq: Once | ORAL | Status: DC | PRN

## 2014-09-25 MED ORDER — FENTANYL CITRATE (PF) 250 MCG/5ML IJ SOLN
INTRAMUSCULAR | Status: AC
  Filled 2014-09-25: qty 5

## 2014-09-25 MED ORDER — PHENYLEPHRINE HCL 10 MG/ML IJ SOLN
INTRAMUSCULAR | Status: DC | PRN
  Administered 2014-09-25 (×4): 40 ug via INTRAVENOUS

## 2014-09-25 MED ORDER — ROCURONIUM BROMIDE 50 MG/5ML IV SOLN
INTRAVENOUS | Status: AC
  Filled 2014-09-25: qty 2

## 2014-09-25 MED ORDER — 0.9 % SODIUM CHLORIDE (POUR BTL) OPTIME
TOPICAL | Status: DC | PRN
  Administered 2014-09-25: 2000 mL

## 2014-09-25 MED ORDER — ONDANSETRON HCL 4 MG/2ML IJ SOLN
INTRAMUSCULAR | Status: AC
Start: 2014-09-25 — End: 2014-09-25
  Filled 2014-09-25: qty 2

## 2014-09-25 MED ORDER — MIDAZOLAM HCL 2 MG/2ML IJ SOLN
INTRAMUSCULAR | Status: AC
  Filled 2014-09-25: qty 2

## 2014-09-25 MED ORDER — PROPOFOL 10 MG/ML IV BOLUS
INTRAVENOUS | Status: DC | PRN
  Administered 2014-09-25: 180 mg via INTRAVENOUS

## 2014-09-25 MED ORDER — OXYCODONE HCL 5 MG PO TABS: 5.0000 mg | ORAL_TABLET | ORAL | Status: DC | PRN

## 2014-09-25 MED ORDER — CHLORHEXIDINE GLUCONATE 4 % EX LIQD
1.0000 | Freq: Once | CUTANEOUS | Status: DC
Start: 2014-09-25 — End: 2014-09-25

## 2014-09-25 MED ORDER — HYDROMORPHONE HCL 1 MG/ML IJ SOLN
0.2500 mg | INTRAMUSCULAR | Status: DC | PRN
  Administered 2014-09-25 (×2): 0.5 mg via INTRAVENOUS

## 2014-09-25 MED ORDER — LACTATED RINGERS IV SOLN
INTRAVENOUS | Status: DC | PRN
  Administered 2014-09-25 (×2): via INTRAVENOUS

## 2014-09-25 MED ORDER — NEOSTIGMINE METHYLSULFATE 10 MG/10ML IV SOLN
INTRAVENOUS | Status: AC
  Filled 2014-09-25: qty 2

## 2014-09-25 MED ORDER — LIDOCAINE HCL (CARDIAC) 20 MG/ML IV SOLN
INTRAVENOUS | Status: DC | PRN
  Administered 2014-09-25: 100 mg via INTRAVENOUS

## 2014-09-25 MED ORDER — DEXTROSE 5 % IV SOLN
INTRAVENOUS | Status: AC
  Filled 2014-09-25: qty 1.5

## 2014-09-25 MED ORDER — PHENYLEPHRINE 40 MCG/ML (10ML) SYRINGE FOR IV PUSH (FOR BLOOD PRESSURE SUPPORT)
PREFILLED_SYRINGE | INTRAVENOUS | Status: AC
  Filled 2014-09-25: qty 10

## 2014-09-25 MED ORDER — EPHEDRINE SULFATE 50 MG/ML IJ SOLN
INTRAMUSCULAR | Status: AC
  Filled 2014-09-25: qty 1

## 2014-09-25 MED ORDER — FENTANYL CITRATE (PF) 250 MCG/5ML IJ SOLN
INTRAMUSCULAR | Status: DC | PRN
  Administered 2014-09-25: 100 ug via INTRAVENOUS
  Administered 2014-09-25: 150 ug via INTRAVENOUS

## 2014-09-25 MED ORDER — HYDROMORPHONE HCL 1 MG/ML IJ SOLN
INTRAMUSCULAR | Status: AC
  Filled 2014-09-25: qty 1

## 2014-09-25 MED ORDER — LIDOCAINE HCL (CARDIAC) 20 MG/ML IV SOLN
INTRAVENOUS | Status: AC
Start: 2014-09-25 — End: 2014-09-25
  Filled 2014-09-25: qty 5

## 2014-09-25 MED ORDER — ARTIFICIAL TEARS OP OINT
TOPICAL_OINTMENT | OPHTHALMIC | Status: AC
  Filled 2014-09-25: qty 3.5

## 2014-09-25 MED ORDER — ONDANSETRON HCL 4 MG/2ML IJ SOLN: 4.0000 mg | Freq: Four times a day (QID) | INTRAMUSCULAR | Status: DC | PRN

## 2014-09-25 MED ORDER — ROCURONIUM BROMIDE 100 MG/10ML IV SOLN
INTRAVENOUS | Status: DC | PRN
  Administered 2014-09-25: 5 mg via INTRAVENOUS
  Administered 2014-09-25: 40 mg via INTRAVENOUS

## 2014-09-25 MED ORDER — MIDAZOLAM HCL 5 MG/5ML IJ SOLN
INTRAMUSCULAR | Status: DC | PRN
  Administered 2014-09-25: 2 mg via INTRAVENOUS

## 2014-09-25 SURGICAL SUPPLY — 57 items
ADH SKN CLS APL DERMABOND .7 (GAUZE/BANDAGES/DRESSINGS) ×2
BALL CTTN LRG ABS STRL LF (GAUZE/BANDAGES/DRESSINGS)
BLADE SURG 15 STRL LF DISP TIS (BLADE) ×2 IMPLANT
BLADE SURG 15 STRL SS (BLADE) ×4
BRUSH CYTOL CELLEBRITY 1.5X140 (MISCELLANEOUS) IMPLANT
CANISTER SUCTION 2500CC (MISCELLANEOUS) ×4 IMPLANT
CLIP TI MEDIUM 6 (CLIP) ×2 IMPLANT
CONT SPEC 4OZ CLIKSEAL STRL BL (MISCELLANEOUS) ×12 IMPLANT
COTTONBALL LRG STERILE PKG (GAUZE/BANDAGES/DRESSINGS) IMPLANT
COVER SURGICAL LIGHT HANDLE (MISCELLANEOUS) ×6 IMPLANT
COVER TABLE BACK 60X90 (DRAPES) ×4 IMPLANT
DERMABOND ADVANCED (GAUZE/BANDAGES/DRESSINGS) ×2
DERMABOND ADVANCED .7 DNX12 (GAUZE/BANDAGES/DRESSINGS) ×2 IMPLANT
DRAPE LAPAROTOMY T 102X78X121 (DRAPES) ×4 IMPLANT
ELECT CAUTERY BLADE 6.4 (BLADE) ×4 IMPLANT
ELECT REM PT RETURN 9FT ADLT (ELECTROSURGICAL) ×4
ELECTRODE REM PT RTRN 9FT ADLT (ELECTROSURGICAL) ×2 IMPLANT
FORCEPS BIOP RJ4 1.8 (CUTTING FORCEPS) ×2 IMPLANT
GAUZE SPONGE 4X4 12PLY STRL (GAUZE/BANDAGES/DRESSINGS) ×4 IMPLANT
GAUZE SPONGE 4X4 16PLY XRAY LF (GAUZE/BANDAGES/DRESSINGS) ×2 IMPLANT
GLOVE EUDERMIC 7 POWDERFREE (GLOVE) ×4 IMPLANT
GOWN STRL REUS W/ TWL LRG LVL3 (GOWN DISPOSABLE) ×2 IMPLANT
GOWN STRL REUS W/ TWL XL LVL3 (GOWN DISPOSABLE) ×2 IMPLANT
GOWN STRL REUS W/TWL LRG LVL3 (GOWN DISPOSABLE) ×4
GOWN STRL REUS W/TWL XL LVL3 (GOWN DISPOSABLE) ×4
HEMOSTAT SURGICEL 2X14 (HEMOSTASIS) IMPLANT
KIT BASIN OR (CUSTOM PROCEDURE TRAY) ×4 IMPLANT
KIT ROOM TURNOVER OR (KITS) ×4 IMPLANT
MARKER SKIN DUAL TIP RULER LAB (MISCELLANEOUS) ×4 IMPLANT
NDL BIOPSY TRANSBRONCH 21G (NEEDLE) IMPLANT
NEEDLE 22X1 1/2 (OR ONLY) (NEEDLE) IMPLANT
NEEDLE BIOPSY TRANSBRONCH 21G (NEEDLE) IMPLANT
NS IRRIG 1000ML POUR BTL (IV SOLUTION) ×4 IMPLANT
OIL SILICONE PENTAX (PARTS (SERVICE/REPAIRS)) ×4 IMPLANT
PACK SURGICAL SETUP 50X90 (CUSTOM PROCEDURE TRAY) ×4 IMPLANT
PAD ARMBOARD 7.5X6 YLW CONV (MISCELLANEOUS) ×8 IMPLANT
PENCIL BUTTON HOLSTER BLD 10FT (ELECTRODE) ×4 IMPLANT
SPONGE GAUZE 4X4 12PLY STER LF (GAUZE/BANDAGES/DRESSINGS) ×2 IMPLANT
SPONGE INTESTINAL PEANUT (DISPOSABLE) ×2 IMPLANT
SUT SILK 2 0 SH (SUTURE) ×4 IMPLANT
SUT SILK 2 0 TIES 10X30 (SUTURE) IMPLANT
SUT VIC AB 2-0 CT1 27 (SUTURE) ×4
SUT VIC AB 2-0 CT1 TAPERPNT 27 (SUTURE) ×2 IMPLANT
SUT VIC AB 3-0 SH 27 (SUTURE)
SUT VIC AB 3-0 SH 27X BRD (SUTURE) IMPLANT
SUT VICRYL 4-0 PS2 18IN ABS (SUTURE) ×4 IMPLANT
SYR 20ML ECCENTRIC (SYRINGE) ×4 IMPLANT
SYR 5ML LUER SLIP (SYRINGE) ×4 IMPLANT
SYR CONTROL 10ML LL (SYRINGE) IMPLANT
SYRINGE 10CC LL (SYRINGE) ×4 IMPLANT
TAPE CLOTH SURG 4X10 WHT LF (GAUZE/BANDAGES/DRESSINGS) ×2 IMPLANT
TOWEL OR 17X24 6PK STRL BLUE (TOWEL DISPOSABLE) ×8 IMPLANT
TOWEL OR 17X26 10 PK STRL BLUE (TOWEL DISPOSABLE) ×4 IMPLANT
TRAP SPECIMEN MUCOUS 40CC (MISCELLANEOUS) ×4 IMPLANT
TUBE CONNECTING 12'X1/4 (SUCTIONS) ×1
TUBE CONNECTING 12X1/4 (SUCTIONS) ×3 IMPLANT
WATER STERILE IRR 1000ML POUR (IV SOLUTION) ×4 IMPLANT

## 2014-09-25 NOTE — Anesthesia Procedure Notes (Signed)
Procedure Name: Intubation Date/Time: 09/25/2014 7:45 AM Performed by: Tamala Fothergill S Patient Re-evaluated:Patient Re-evaluated prior to inductionOxygen Delivery Method: Circle system utilized Preoxygenation: Pre-oxygenation with 100% oxygen Intubation Type: IV induction Ventilation: Oral airway inserted - appropriate to patient size and Mask ventilation without difficulty Laryngoscope Size: Miller and 2 Grade View: Grade II Tube type: Oral Tube size: 8.5 mm Number of attempts: 1 Placement Confirmation: ETT inserted through vocal cords under direct vision,  breath sounds checked- equal and bilateral and positive ETCO2 Secured at: 23 cm Tube secured with: Tape Dental Injury: Teeth and Oropharynx as per pre-operative assessment

## 2014-09-25 NOTE — Brief Op Note (Signed)
09/25/2014  9:27 AM  PATIENT:  Dwayne [name]  39 y.o. male  PRE-OPERATIVE DIAGNOSIS:  MEDIASTINAL ADENOPATHY BILATERAL PULMONARY NODULES  POST-OPERATIVE DIAGNOSIS:  MEDIASTINAL ADENOPATHY BILATERAL PULMONARY   PROCEDURE:  Procedure(s): FLEXIBLE BRONCHOSCOPY (N/A) MEDIASTINOSCOPY (N/A)  SURGEON:  Surgeon(s) and Role:    * Gaye Pollack, MD - Primary  PHYSICIAN ASSISTANT: none  ASSISTANTS: none   ANESTHESIA:   general  EBL:     BLOOD ADMINISTERED:none  DRAINS: none   LOCAL MEDICATIONS USED:  NONE  SPECIMEN:  Source of Specimen:  biopsies of LLL, lymph node biopsies at 4R, 10R  DISPOSITION OF SPECIMEN:  PATHOLOGY  COUNTS:  YES  TOURNIQUET:  * No tourniquets in log *  DICTATION: .Note written in EPIC  PLAN OF CARE: Discharge to home after PACU  PATIENT DISPOSITION:  PACU - hemodynamically stable.   Delay start of Pharmacological VTE agent (>24hrs) due to surgical blood loss or risk of bleeding: not applicable

## 2014-09-25 NOTE — Transfer of Care (Signed)
Immediate Anesthesia Transfer of Care Note  Patient: Dwayne [name]  Procedure(s) Performed: Procedure(s): FLEXIBLE BRONCHOSCOPY (N/A) MEDIASTINOSCOPY (N/A)  Patient Location: PACU  Anesthesia Type:General  Level of Consciousness: awake, alert  and oriented  Airway & Oxygen Therapy: Patient Spontanous Breathing and Patient connected to nasal cannula oxygen  Post-op Assessment: Report given to RN, Post -op Vital signs reviewed and stable and Patient moving all extremities X 4  Post vital signs: Reviewed and stable  Last Vitals:  Filed Vitals:   09/25/14 0604  BP: 124/75  Pulse: 73  Temp: 36.7 C  Resp: 18    Complications: No apparent anesthesia complications

## 2014-09-25 NOTE — Anesthesia Preprocedure Evaluation (Signed)
Anesthesia Evaluation  Patient identified by MRN, date of birth, ID band Patient awake    Reviewed: Allergy & Precautions, NPO status , Patient's Chart, lab work & pertinent test results  Airway Mallampati: II   Neck ROM: full    Dental   Pulmonary former smoker,  breath sounds clear to auscultation        Cardiovascular negative cardio ROS  Rhythm:regular Rate:Normal     Neuro/Psych    GI/Hepatic   Endo/Other    Renal/GU      Musculoskeletal   Abdominal   Peds  Hematology   Anesthesia Other Findings   Reproductive/Obstetrics                             Anesthesia Physical Anesthesia Plan  ASA: II  Anesthesia Plan: General   Post-op Pain Management:    Induction: Intravenous  Airway Management Planned: Oral ETT  Additional Equipment:   Intra-op Plan:   Post-operative Plan: Extubation in OR  Informed Consent: I have reviewed the patients History and Physical, chart, labs and discussed the procedure including the risks, benefits and alternatives for the proposed anesthesia with the patient or authorized representative who has indicated his/her understanding and acceptance.     Plan Discussed with: CRNA, Anesthesiologist and Surgeon  Anesthesia Plan Comments:         Anesthesia Quick Evaluation

## 2014-09-25 NOTE — Op Note (Signed)
CARDIOTHORACIC SURGERY OPERATIVE NOTE:  Joshua Soulier [id number] 09/25/2014   Preoperative Dx:  Mediastinal and hilar lymphadenopathy with peribronchial nodularity in the lungs  Postoperative Dx: Same   Procedure: Flexible fiberoptic bronchoscopy with biopsies, Mediastinoscopy with lymph node biopsies.  Surgeon: Dr. Gaye Pollack   Assistant: none  Anesthesia: GET   Clinical History:    The patient is a 39 year old gentleman who presented with a several month history of weight loss, decreased appetite, night sweats and itching. Labs showed elevated LFTs with Alk Phos of 636, SGOT 68 and SGPT of 62 with Sed rate of 55. Abdominal US showed a cystic lesion in the pancreatic head. Abdominal CT showed abdominal adenopathy, innumerable splenic and hepatic lesions and pulmonary peribronchovascular nodularity suggesting sarcoid. There was no pancreatic lesion. Hi res chest CT showed bilateral hilar and mediastinal adenopathy with perilymphatic distribution of nodularity throughout the lungs, most indicative of sarcoid. He underwent bronchoscopy with biopsy of the LLL and all biopsies showed no malignancy and no clear findings of sarcoid.  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 Transbronchial biopsies of the LLL were non-diagnostic. I think it would be best to do mediastinoscopy with lymph node biopsies and repeat transbronchial biopsies under general anesthesia. I discussed the operative procedure with the patient including alternatives, benefits and risks including but not limited to bleeding, infection, injury to mediastinal structures, pneumothorax and inability to make a firm diagnosis. He understands and agrees to proceed.  Operative procedure:   The patient was seen in the preoperative holding area. The proper patient,  proper operation were confirmed  after reviewing his history and chest x-ray.  Preoperative intravenous antibiotics were given. He was taken back to the operating room and placed on table in the supine position. After induction of general endotracheal anesthesia using a single-lumen tube a time out was taken. Then flexible fiberoptic bronchoscopy was performed. The distal trachea was normal. The carina was sharp. The right and left bronchial trees had normal segmental anatomy. There were no endobronchial lesions and no sign of extrinsic compression. Then multiple biopsies were taken in the left lower lobe which was the area with the most significant changes seen on CT scan. There was significant oozing of blood after each biopsy which resolved with irrigation and time.   Then mediastinoscopy was performed.   Mediastinoscopy:  A roll was placed beneath the shoulders and the neck slightly extended. The neck and chest were prepped with betadine soap and solution and draped in the usual sterile manner. A 2nd time out was taken and the correct patient and procedure were confirmed with nursing and anesthesia. A 2 cm incision was made horizontally just above the sternal notch in the natural skin crease. Electrocautery was used to continue down through the subcutaneous tissue. The strap muscles were separated in the midline to expose the trachea. A pretracheal plane was developed bluntly and the mediastinoscope inserted. It was advanced along the anterior tracheal wall. There were multiple lymph nodes seen and biopsies were taken at 4 R and10 R. These lymph nodes were grossly abnormal being hard and gritty.  These were sent for permanent pathology. I tried to get to the subcarinal region but the right pulmonary artery was firmly adherent to the trachea and I was concerned about injuring it. Hemostasis was complete at the end of the procedure. The scope was removed and the strap muscles  re-approximated with interrupted 3-0 vicryl sutures. The platysma  muscle was re-approximated with 3-0 vicryl suture and the skin with 4-0 vicryl subcuticular suture. Dermabond was applied. The sponge, needle, and instrument counts were correct according to the nurses. The patient was awakened, extubated and transported to the PACU in stable condition.  The sponge needle and instrument counts were correct according to the scrub nurse. The patient was then  Extubated and transported to the post anesthesia care unit in satisfactory and stable condition.

## 2014-09-25 NOTE — Interval H&P Note (Signed)
History and Physical Interval Note:  09/25/2014 6:54 AM  Olin Hauser  has presented today for surgery, with the diagnosis of MEDIASTINAL ADENOPATHY BILATERAL PULMONARY NODULES  The various methods of treatment have been discussed with the patient and family. After consideration of risks, benefits and other options for treatment, the patient has consented to  Procedure(s): FLEXIBLE BRONCHOSCOPY (N/A) MEDIASTINOSCOPY (N/A) as a surgical intervention .  The patient's history has been reviewed, patient examined, no change in status, stable for surgery.  I have reviewed the patient's chart and labs.  Questions were answered to the patient's satisfaction.     Gaye Pollack

## 2014-09-25 NOTE — Anesthesia Postprocedure Evaluation (Signed)
Anesthesia Post Note  Patient: Dwayne [name]  Procedure(s) Performed: Procedure(s) (LRB): FLEXIBLE BRONCHOSCOPY (N/A) MEDIASTINOSCOPY (N/A)  Anesthesia type: General  Patient location: PACU  Post pain: Pain level controlled and Adequate analgesia  Post assessment: Post-op Vital signs reviewed, Patient's Cardiovascular Status Stable, Respiratory Function Stable, Patent Airway and Pain level controlled  Last Vitals:  Filed Vitals:   09/25/14 1033  BP: 125/76  Pulse: 78  Temp:   Resp: 7    Post vital signs: Reviewed and stable  Level of consciousness: awake, alert  and oriented  Complications: No apparent anesthesia complications

## 2014-09-25 NOTE — Discharge Instructions (Signed)
Resume normal activity as tolerated  You may cough up some blood tinged secretions for a few days.  You may shower  Do not drive or operate machinery until tomorrow.

## 2014-09-25 NOTE — H&P (Signed)
[address]       [place] [address]             [phone]      Cardiothoracic Surgery History and Physical   PCP is Donnie Coffin, MD Referring Provider is Juanito Doom, MD  Chief Complaint  Patient presents with  . Mediastinal and hilar adenopathy with bilateral peribronchial nodularity, rule out Sarcoid   HPI:  The patient is a 39 year old gentleman who presented with a several month history of weight loss, decreased appetite, night sweats and itching. Labs showed elevated LFTs with Alk Phos of 636, SGOT 68 and SGPT of 62 with Sed rate of 55. Abdominal US showed a cystic lesion in the pancreatic head. Abdominal CT showed abdominal adenopathy, innumerable splenic and hepatic lesions and pulmonary peribronchovascular nodularity suggesting sarcoid. There was no pancreatic lesion. Hi res chest CT showed bilateral hilar and mediastinal adenopathy with perilymphatic distribution of nodularity throughout the lungs, most indicative of sarcoid. He underwent bronchoscopy with biopsy of the LLL and all biopsies showed no malignancy and no clear findings of sarcoid.  Past Medical History  Diagnosis Date  . Allergic rhinitis     Past Surgical History  Procedure Laterality Date  . Vasectomy      2002  . Video bronchoscopy Bilateral 08/13/2014    Procedure: VIDEO BRONCHOSCOPY WITH FLUORO; Surgeon: Juanito Doom, MD; Location: [facility]; Service: Cardiopulmonary; Laterality: Bilateral;    Family History  Problem Relation Age of Onset  . COPD Mother     Social History History  Substance Use Topics  . Smoking status: Former Smoker -- 0.30 packs/day for 20 years    Types: Cigarettes    Quit date: 08/05/2014  . Smokeless tobacco: Never Used  . Alcohol Use: 0.0 oz/week    0 Standard drinks or equivalent per week    No current outpatient prescriptions on file.   No  current facility-administered medications for this visit.    No Known Allergies  Review of Systems  Constitutional: Positive for appetite change and unexpected weight change. Negative for fever, chills, activity change and fatigue.  Eyes: Negative.  Respiratory: Positive for cough. Negative for shortness of breath.  Cardiovascular: Negative for chest pain, palpitations and leg swelling.  Gastrointestinal: Negative.  Endocrine: Negative.  Genitourinary: Negative.  Musculoskeletal: Negative.  Skin: Negative.  Neurological: Negative.  Hematological: Negative.  Psychiatric/Behavioral: Negative.    BP 124/75 mmHg  Pulse 84  Resp 20  Ht _0  (1.753 m)  Wt 175 lb (79.379 kg)  BMI 25.83 kg/m2  SpO2 97% Physical Exam   Diagnostic Tests:  CLINICAL DATA: Abnormal finding on recent CT abdomen 07/14/2014, question sarcoid, initial encounter.  EXAM: CT CHEST WITHOUT CONTRAST  TECHNIQUE: Multidetector CT imaging of the chest was performed following the standard protocol without intravenous contrast. High resolution imaging of the lungs, as well as inspiratory and expiratory imaging, was performed.  COMPARISON: CT abdomen 07/14/2014.  FINDINGS: Mediastinum/Nodes: Mediastinal lymph nodes measure up to 8 mm in the AP window. Subcarinal lymph node measures 1.8 cm. Hilar regions are difficult to definitively evaluate without IV contrast but appear somewhat bulky, suggesting adenopathy. Axillary lymph nodes are subcentimeter in short axis size. Heart size normal. No pericardial effusion. Pre pericardiac lymph nodes are not enlarged by CT size criteria.  Lungs/Pleura: Diffuse peribronchovascular nodularity with involvement of the fissures. Associated mild consolidation in the left lower lobe. No pleural fluid. Airway is unremarkable.  Upper abdomen: Visualized portion of the liver is somewhat heterogeneous and better visualized on 07/14/2014.  Visualized portions of the adrenal glands are unremarkable. Splenic nodularity also better visualized on 07/14/2014. Gastrohepatic ligament lymph nodes measure up to 12 mm.  Musculoskeletal: No worrisome lytic or sclerotic lesions.  IMPRESSION: 1. Mediastinal and presumed bi hilar adenopathy, with perilymphatic distribution of nodularity throughout the lungs, most indicative of sarcoid. While confluent areas of consolidation in the left lower lobe are also felt to be within the scope of sarcoid, a superimposed infectious or inflammatory process cannot be definitively excluded. 2. 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   Electronically Signed  By: Lorin Picket M.D.  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 Transbronchial biopsies of the LLL were non-diagnostic. I think it would be best to do mediastinoscopy with lymph node biopsies and repeat transbronchial biopsies under general anesthesia. I discussed the operative procedure with the patient including alternatives, benefits and risks including but not limited to bleeding, infection, injury to mediastinal structures, pneumothorax and inability to make a firm diagnosis. He understands and agrees to proceed.  Plan:  Bronchoscopy with transbronchial biopsies and mediastinoscopy with lymph node biopsies.   Gaye Pollack, MD [facility] [phone]

## 2014-09-25 NOTE — Progress Notes (Signed)
Report given to philip lopez rn as caregiver 

## 2014-09-26 ENCOUNTER — Encounter (HOSPITAL_COMMUNITY): Payer: Self-pay | Admitting: Surgery

## 2014-09-29 ENCOUNTER — Encounter: Payer: Self-pay | Admitting: Pulmonary Disease

## 2014-09-29 ENCOUNTER — Ambulatory Visit (INDEPENDENT_AMBULATORY_CARE_PROVIDER_SITE_OTHER): Payer: BLUE CROSS/BLUE SHIELD | Admitting: Pulmonary Disease

## 2014-09-29 VITALS — BP 122/82 | HR 75 | Temp 98.9°F | Wt 182.4 lb

## 2014-09-29 DIAGNOSIS — D869 Sarcoidosis, unspecified: Secondary | ICD-10-CM | POA: Diagnosis not present

## 2014-09-29 DIAGNOSIS — R162 Hepatomegaly with splenomegaly, not elsewhere classified: Secondary | ICD-10-CM | POA: Diagnosis not present

## 2014-09-29 DIAGNOSIS — R591 Generalized enlarged lymph nodes: Secondary | ICD-10-CM

## 2014-09-29 MED ORDER — PREDNISONE 20 MG PO TABS: ORAL_TABLET | ORAL | Status: DC

## 2014-09-29 NOTE — Progress Notes (Signed)
Subjective:     Patient ID: Dwayne [name], male   DOB: 10/14/1975, 39 y.o.   MRN: [id number]  HPI ~  July 30, 2014:  Initial pulmonary consult by SN>  54 y/o BM, referred by Dr. 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  Koren has enjoyed excellent general medical health & his only medication is Benedryl prn;  About 20mo ago he noted itching & decrease in appetite assoc w/ nausea & a 50 lb wt loss ?etiology; he was treated w/ Pred & Atarax & the itching improved; Labs showed elev LFTs w/ AlkPhos=636, GOT=68, GPT=62, Sed=55 (other Chems/ CBC/ TSH all wnl); repeat labs 2/16 were similar (plus Hep screen waqs neg) & he had AbdSonar 3/16 showing no gallstones, norm CBD, cystic lesion in pancreatic head, nonobstructing stone in right kidney; f/u CT Abd was surprising- no pancreatic lesion seen but he had mod hepatomegaly/ steatosis/ & heterogeneous enhancement (due to liver lesions), innumerable low-density splenic lesions, extensive abdominal adenopathy, bilat punctate renal collecting system calculi, and visualize lung bases w/ L>R nodularity & fibrosis & adenopathy seen...  PMHx, FamHx, SocHx are non-contributory; Married, one son, and he is a smoker- <1/2ppd, <10 pack-year history overall; and he denies cough, sput, hemoptysis, f/c/s, CP, etc...  EXAM reveals Afeb, VSS, O2sat=99% on RA at rest;  HEENT- neg, mallampati 2;  Chest- clear w/o w/r/r;  Heart- RR w/o m/r/g;  Ext- neg w/o c/c/e; no palp adenopathy...  CXR>  (he forgot to go to [facility] for this baseline film)  High- Res CT Chest w/ contrast>  DONE 08/04/14> bilat hilar & mediastinal adenopathy including 1.8cm subcarinal node; diffuse peribronchovasc nodularity w/ assoc mild consolid in LLL; heterogeneous liver and splenic nodularity...  Full PFTs w/ lung volumes and DLCO>  DONE 08/04/14> FVC=3.56 (89%), FEV1=2.87 (87%), %1sec=81, mid-flows normal at 86% predicted;  TLC=4.03 (63%),  RV=?0.39 (24%)?;  DLCO=?130% predicted?   Additional Labs w/ ACE level> Chems- wnl;  ACE>100 (8-52)...         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 Once we get to see these studies we will contact the pt regarding the best approach to Bx for tissue diagnosis, only then can we formulate a treatment plan...  ADDENDUM>>  All data collected is concordant for the diagnosis of Sarcoidosis- CT Chest shows BHA, mediastinal adenopathy, and parenchymal nodularity- making it likely that we can obtain a diagnositic Bx from lung tissue &/or mediatinal LN => we will arrange for Bronchoscopy w/ Bx...   ADDENDUM 08/13/14>> Bronch w/ TBBx by [facility] showed norm appearing airways and TBBx were surprizingly NEG- no granulomatous dis, no malig, neg Bact/ AFB/ Fungi... Decision made to proceed w/ Mediastinoscopy for node Bx... ADDENDUM 09/25/14>> B&M by DrBartle w/ benign LNs and +non-caseating granulomas consistent w/ sarcoidosis...   ~  September 29, 2014:  63mo ROV and follow-up after recent outpt B&M by DrBartle>  Nodes were pos for non-caseating granulomas consistent w/ sarcoidosis;  We discussed treatment w/ PREDNISONE & pt admonished to elim sodium, no sugars/ sweets, etc... We will start Pred20- 2tabs Qam for about 2 weeks, then decrease to  $'30mg'T$  daily til ret in 1 month when we will recheck CXR & labs... EXAM shows Afeb, VSS, O2sat=99% on RA:  HEENT- neg;  Chest- clear w/o w/r/r;  Mediastinoscopy scar healing well;  Heart- RR w/o m/r/g;  Ext- w/o c/c/e...   CXR 09/25/14 showed diffuse nodular interstitial dis w/ mild adenopathy, no change...   LABS 6/16:  Chems- ok x elev LFTs- AlkPhos=445,  GOT=66, GPT=77;  CBC- wnl;      Past Medical History  Diagnosis Date  . Allergic rhinitis     Past Surgical History  Procedure Laterality Date  . Vasectomy      2002  . Video bronchoscopy Bilateral 08/13/2014    Procedure: VIDEO BRONCHOSCOPY WITH FLUORO;  Surgeon: Juanito Doom, MD;  Location: [place];  Service: Cardiopulmonary;  Laterality: Bilateral;  . Flexible bronchoscopy N/A 09/25/2014    Procedure: FLEXIBLE BRONCHOSCOPY;  Surgeon: Gaye Pollack, MD;  Location: [place];  Service: Thoracic;  Laterality: N/A;  . Mediastinoscopy N/A 09/25/2014    Procedure: MEDIASTINOSCOPY;  Surgeon: Gaye Pollack, MD;  [facility];  Service: Thoracic;  Laterality: N/A;    Outpatient Encounter Prescriptions as of 09/29/2014  Medication Sig  . oxyCODONE (ROXICODONE) 5 MG immediate release tablet Take 1 tablet (5 mg total) by mouth every 4 (four) hours as needed for severe pain. (Patient not taking: Reported on 09/29/2014)    No Known Allergies   Current Medications, Allergies, Past Medical History, Past Surgical History, Family History, and Social History were reviewed in [employer] record.   Review of Systems         All symptoms NEG except when BOLDED >>  Constitutional:  Denies F/C/S, decreased appetite & unexpected 50# weight loss. HEENT:  No HA, visual changes, earache, nasal symptoms, sore throat, hoarseness. Resp:  No cough, sputum, hemoptysis; no SOB, tightness, wheezing. Cardio:  No CP, palpit, DOE, orthopnea, edema. GI:  +Nausea w/o V/D/C or blood in stool; no reflux, abd pain, distention, or gas. GU:  No dysuria, freq, urgency, hematuria, or flank pain. MS:  Denies joint pain, swelling, tenderness, or decr ROM; no neck pain, back pain, etc. Neuro:  No tremors, seizures, dizziness, syncope, weakness, numbness, gait abn. Skin:  No suspicious lesions or skin rash, prev itching resolved... Heme:  +adenopathy, no bruising/ bleeding. Psyche: Denies  confusion, sleep disturbance, hallucinations, anxiety, depression.   Objective:   Physical Exam    Vital Signs:  Reviewed...  General:  WD, WN, 39 y/o BM in NAD; alert & oriented; pleasant & cooperative... HEENT:  [place]/AT; Conjunctiva- pink, Sclera- nonicteric, EOM-wnl, PERRLA, EACs-clear, TMs-wnl; NOSE-clear; THROAT-clear & wnl. Neck:  Supple w/ full ROM; no JVD; normal carotid impulses w/o bruits; no thyromegaly or nodules palpated; no lymphadenopathy. Chest:  Clear to P & A; without wheezes, rales, or rhonchi heard. Heart:  Regular Rhythm; norm S1 & S2 without murmurs, rubs, or gallops detected. Abdomen:  Soft w/ min epig tenderness- no guarding or rebound; normal bowel sounds; liver edge palpated, no masses felt... Ext:  Normal ROM; without deformities or arthritic changes; no varicose veins, venous insuffic, or edema;  Pulses intact w/o bruits. Neuro:  CNs II-XII intact; motor testing normal; sensory testing normal; gait normal & balance OK. Derm:  No lesions noted; no rash etc. Lymph:  No cervical, supraclavicular, axillary, or inguinal adenopathy palpated.   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 Abn LFTs (  AlkPhos> GOT/GPT) w/ mod hepatomegaly and heterogenous enhancement (due to steatosis & numerous lesions)... Splenic enlargement too w/ innumerable low density lesions throughout... Extensive intra-abdominal adenopathy... Bronch & Mediastinoscopy 6/16 pos for noncaseating granulomas in nodes (scant in lung parenchyma) and neg for malig, neg Bact/ AFB/ Fungi...  PLAN >> we discussed starting treatment w/ Pred $Remov'20mg'gaqQcD$ '40mg'$ /d for 2 weeks then $RemoveBe'30mg'OTMNyysrT$ /d til ret OV in 56mo w/ f/u CXR/ LABS at that time...     Plan:     Patient's Medications  New Prescriptions   PREDNISONE (DELTASONE) 20 MG TABLET    Take as directed  Previous Medications   OXYCODONE (ROXICODONE) 5 MG IMMEDIATE RELEASE  TABLET    Take 1 tablet (5 mg total) by mouth every 4 (four) hours as needed for severe pain.  Modified Medications   No medications on file  Discontinued Medications   No medications on file

## 2014-09-29 NOTE — Patient Instructions (Signed)
Today we updated your med list in our EPIC system...    Continue your current medications the same...  We reviewed the results of your surgical biopsies> this looks like diffuse sarcoidosis...    We are recommending treatment w/ PREDNISONE 20mg  tabs >>    Start w/ 2 tabs each AM for 2 weeks then 1.5 tabs each AM thereafter til return visit...  REMEMBER>  No salt/sodium in diet AND low carb- no sugars/ sweets/ etc...  Call for any questions...  Let's plan a follow up visit in 58mo, sooner if needed for problems.Marland KitchenMarland Kitchen

## 2014-10-02 ENCOUNTER — Ambulatory Visit: Payer: BLUE CROSS/BLUE SHIELD | Admitting: Pulmonary Disease

## 2014-10-03 ENCOUNTER — Ambulatory Visit: Payer: BLUE CROSS/BLUE SHIELD | Admitting: Pulmonary Disease

## 2014-10-29 ENCOUNTER — Other Ambulatory Visit (INDEPENDENT_AMBULATORY_CARE_PROVIDER_SITE_OTHER): Payer: BLUE CROSS/BLUE SHIELD

## 2014-10-29 ENCOUNTER — Ambulatory Visit (INDEPENDENT_AMBULATORY_CARE_PROVIDER_SITE_OTHER)
Admission: RE | Admit: 2014-10-29 | Discharge: 2014-10-29 | Disposition: A | Discharge status: Home / Self Care | Payer: BLUE CROSS/BLUE SHIELD | Source: Ambulatory Visit | Attending: Pulmonary Disease | Admitting: Pulmonary Disease

## 2014-10-29 ENCOUNTER — Ambulatory Visit (INDEPENDENT_AMBULATORY_CARE_PROVIDER_SITE_OTHER): Payer: BLUE CROSS/BLUE SHIELD | Admitting: Pulmonary Disease

## 2014-10-29 VITALS — BP 120/80 | HR 78 | Temp 97.3°F | Wt 190.0 lb

## 2014-10-29 DIAGNOSIS — D869 Sarcoidosis, unspecified: Secondary | ICD-10-CM

## 2014-10-29 DIAGNOSIS — R162 Hepatomegaly with splenomegaly, not elsewhere classified: Secondary | ICD-10-CM

## 2014-10-29 DIAGNOSIS — R591 Generalized enlarged lymph nodes: Secondary | ICD-10-CM

## 2014-10-29 LAB — BASIC METABOLIC PANEL
BUN: 13 mg/dL (ref 6–23)
CALCIUM: 9.3 mg/dL (ref 8.4–10.5)
CO2: 31 meq/L (ref 19–32)
Chloride: 103 mEq/L (ref 96–112)
Creatinine, Ser: 0.87 mg/dL (ref 0.40–1.50)
GFR: 126.49 mL/min (ref >60.00)
GLUCOSE: 105 mg/dL — AB (ref 70–99)
Potassium: 3.3 mEq/L — ABNORMAL LOW (ref 3.5–5.1)
SODIUM: 141 meq/L (ref 135–145)

## 2014-10-29 LAB — HEPATIC FUNCTION PANEL
ALBUMIN: 3.9 g/dL (ref 3.5–5.2)
ALT: 104 U/L — ABNORMAL HIGH (ref 0–53)
AST: 58 U/L — ABNORMAL HIGH (ref 0–37)
Alkaline Phosphatase: 320 U/L — ABNORMAL HIGH (ref 39–117)
BILIRUBIN DIRECT: 0.2 mg/dL (ref 0.0–0.3)
BILIRUBIN TOTAL: 0.8 mg/dL (ref 0.2–1.2)
TOTAL PROTEIN: 7.4 g/dL (ref 6.0–8.3)

## 2014-10-29 LAB — ANGIOTENSIN CONVERTING ENZYME: Angiotensin-Converting Enzyme: 97 U/L — ABNORMAL HIGH (ref 8–52)

## 2014-10-29 NOTE — Patient Instructions (Signed)
Today we updated your med list in our EPIC system...    Continue your current medications the same...  Remember to watch your caloric intake- stay away from [place] check the internet for the good carbsd w/ low glycemic index...  Today we did your follow up CXR & blood work...    We will contact you w/ the results when available...     And we will then determine any further weaning of the Prednisone dose...  Call for any questions...  Let's plan a follow up visit in 2 month's time.Marland KitchenMarland Kitchen

## 2014-10-30 ENCOUNTER — Encounter: Payer: Self-pay | Admitting: Pulmonary Disease

## 2014-10-30 NOTE — Progress Notes (Signed)
Subjective:     Patient ID: Dwayne [name], male   DOB: 04/03/1976, 39 y.o.   MRN: [id number]  HPI ~  July 30, 2014:  Initial pulmonary consult by SN>  73 y/o BM, referred by Dr. 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 Dwayne [name] has enjoyed excellent general medical health & his only medication is Benedryl prn;  About 48moago he noted itching & decrease in appetite assoc w/ nausea & a 50 lb wt loss ?etiology; he was treated w/ Pred & Atarax & the itching improved; Labs showed elev LFTs w/ AlkPhos=636, GOT=68, GPT=62, Sed=55 (other Chems/ CBC/ TSH all wnl); repeat labs 2/16 were similar (plus Hep screen was neg) & he had AbdSonar 3/16 showing no gallstones, norm CBD, cystic lesion in pancreatic head, nonobstructing stone in right kidney; f/u CT Abd was surprising- no pancreatic lesion seen but he had mod hepatomegaly/ steatosis/ & heterogeneous enhancement (due to liver lesions), innumerable low-density splenic lesions, extensive abdominal adenopathy, bilat punctate renal collecting system calculi, and visualize lung bases w/ L>R nodularity & fibrosis & adenopathy seen...  PMHx, FamHx, SocHx are non-contributory; Married, one son, and he is a smoker- <1/2ppd, <10 pack-year history overall; and he denies cough, sput, hemoptysis, f/c/s, CP, etc...  EXAM reveals Afeb, VSS, O2sat=99% on RA at rest;  HEENT- neg, mallampati 2;  Chest- clear w/o w/r/r;  Heart- RR w/o m/r/g;  Ext- neg w/o c/c/e; no palp adenopathy...  [facility]>  [facility]  High- Res CT Chest w/ contrast>  DONE 08/04/14> bilat hilar & mediastinal adenopathy including 1.8cm subcarinal node; diffuse peribronchovasc nodularity w/ assoc mild consolid in LLL; heterogeneous liver and splenic nodularity...  Full PFTs w/ lung volumes and DLCO>  DONE 08/04/14> FVC=3.56 (89%), FEV1=2.87 (87%), %1sec=81, mid-flows normal at 86% predicted;  TLC=4.03 (63%),  RV=?0.39 (24%)?;  DLCO=?130% predicted?   Additional Labs w/ ACE level> Chems- wnl;  ACE>100 (8-52)...        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 Once we get to see these studies we will contact the pt regarding the best approach to Bx for tissue diagnosis, only then can we formulate a treatment plan...  ADDENDUM>>  All data collected is concordant for the diagnosis of Sarcoidosis- CT Chest shows BHA, mediastinal adenopathy, and parenchymal nodularity- making it likely that we can obtain a diagnositic Bx from lung tissue &/or mediatinal LN => we will arrange for Bronchoscopy w/ Bx...   ADDENDUM 08/13/14>> [facility] and TBBx were surprizingly NEG- no granulomatous dis, no malig, neg Bact/ AFB/ Fungi... Decision made to proceed w/ Mediastinoscopy for node Bx... ADDENDUM 09/25/14>> B&M by DrBartle w/ benign LNs and +non-caseating granulomas consistent w/ sarcoidosis...   ~  September 29, 2014:  227moOV w/ SN>  follow-up after recent outpt B&M by DrBartle>  Nodes were pos for non-caseating granulomas consistent w/ sarcoidosis;  We discussed treatment w/ PREDNISONE & pt admonished to elim sodium, no sugars/ sweets, etc... We will start Pred20- 2tabs Qam for about 2 weeks, then decrease to  75m daily til ret in 1 month when we will recheck CXR & labs... EXAM shows Afeb, VSS, O2sat=99% on RA:  HEENT- neg;  Chest- clear w/o w/r/r;  Mediastinoscopy scar healing well;  Heart- RR w/o m/r/g;  Ext- w/o c/c/e...   CXR 09/25/14 showed diffuse nodular interstitial dis w/ mild adenopathy, no change...   LABS 6/16:  Chems- ok x elev LFTs- AlkPhos=445,  GOT=66, GPT=77;  CBC- wnl;    ~  October 29, 2014:  173moOV w/ SN>  [name] been on the Pred as planned- 4011m x2wks, then 31m63mtil return; he feels OK- about the same- no cough, sput, hemoptysis, SOB, appetite better & he's gained 7# to 190# today; no further GI symptoms- w/o n/v/ itching etc... Due for f/u CXR & labs>     Sarcoidosis w/ lung, reticuloendothelial, & visceral involvement> started on Pred 6/16- 40=>30 w/ f/u CXR labs today    Blat punctate renal collecting system calculi> aware EXAM shows Afeb, VSS, O2sat=99% on RA:  HEENT- neg;  Chest- clear w/o w/r/r;  Mediastinoscopy scar healing well & sm stitch removed;  Heart- RR w/o m/r/g;  Ext- w/o c/c/e...  CXR 10/29/14 showed norm heart size & stable subtle diffuse nodularity; to my review the diffuse LD/nodularity is sl better overall...  LABS 7/16:  Chems- ok x K=3.3, BS=105;  LFTs- sl improved AlkPhos & AST (ALT is sl worse);  ACE improved to <100 at 97 today (norm<52). IMP/PLAN>>  Tol Pred satis and he has had a sl initial response- we will need to wean Pred slowly; Rec to continue Pred 31mg33mor 58mo t19mo8/15, then decr to 20mg/d58mil ROV 9/19... Remember- no salt, no sugars, low fat!    Past Medical History  Diagnosis Date  . Allergic rhinitis     Past Surgical History  Procedure Laterality Date  . Vasectomy      2002  . Video bronchoscopy Bilateral 08/13/2014    Procedure: VIDEO BRONCHOSCOPY WITH FLUORO;  Surgeon: DouglasJuanito DoomLocation[facility] [place];  Laterality: Bilateral;  . Flexible bronchoscopy N/A 09/25/2014    Procedure: FLEXIBLE BRONCHOSCOPY;  Surgeon: Bryan KGaye PollackLocation: [facility] OR; [place]: Thoracic;  Laterality: N/A;  . Mediastinoscopy N/A 09/25/2014    Procedure: MEDIASTINOSCOPY;  Surgeon: Bryan KGaye PollackLocation: [facility] OR;  Service: Thoracic;  Laterality: N/A;    Outpatient Encounter Prescriptions as of 10/29/2014  Medication Sig  . predniSONE (DELTASONE) 20  MG tablet Take as directed  . oxyCODONE (ROXICODONE) 5 MG immediate release tablet Take 1 tablet (5 mg total) by mouth every 4 (four) hours as needed for severe pain. (Patient not taking: Reported on 09/29/2014)   No facility-administered encounter medications on file as of 10/29/2014.    No Known Allergies   Current Medications, Allergies, Past Medical History, Past Surgical History, Family History, and Social History were reviewed in [employer].   Review of Systems         All symptoms NEG except when BOLDED >>  Constitutional:  Denies F/C/S, decreased appetite & unexpected 50# weight loss. HEENT:  No HA, visual changes, earache, nasal symptoms, sore throat, hoarseness. Resp:  No cough, sputum, hemoptysis; no SOB, tightness, wheezing. Cardio:  No CP, palpit, DOE, orthopnea, edema. GI:  +Nausea w/o V/D/C or blood in stool; no reflux, abd pain, distention, or gas. GU:  No dysuria, freq, urgency, hematuria, or flank pain. MS:  Denies joint pain,  swelling, tenderness, or decr ROM; no neck pain, back pain, etc. Neuro:  No tremors, seizures, dizziness, syncope, weakness, numbness, gait abn. Skin:  No suspicious lesions or skin rash, prev itching resolved... Heme:  +adenopathy, no bruising/ bleeding. Psyche: Denies confusion, sleep disturbance, hallucinations, anxiety, depression.   Objective:   Physical Exam    Vital Signs:  Reviewed...  General:  WD, WN, 39 y/o BM in NAD; alert & oriented; pleasant & cooperative... HEENT:  [place]/AT; Conjunctiva- pink, Sclera- nonicteric, EOM-wnl, PERRLA, EACs-clear, TMs-wnl; NOSE-clear; THROAT-clear & wnl. Neck:  Supple w/ full ROM; no JVD; normal carotid impulses w/o bruits; no thyromegaly or nodules palpated; no lymphadenopathy. Chest:  Clear to P & A; without wheezes, rales, or rhonchi heard. Heart:  Regular Rhythm; norm S1 & S2 without murmurs, rubs, or gallops detected. Abdomen:  Soft w/ min epig tenderness- no  guarding or rebound; normal bowel sounds; liver edge palpated, no masses felt... Ext:  Normal ROM; without deformities or arthritic changes; no varicose veins, venous insuffic, or edema;  Pulses intact w/o bruits. Neuro:  CNs II-XII intact; motor testing normal; sensory testing normal; gait normal & balance OK. Derm:  No lesions noted; no rash etc. Lymph:  No cervical, supraclavicular, axillary, or inguinal adenopathy palpated.   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    Bronch & Mediastinoscopy 6/16 pos for noncaseating granulomas in nodes (scant in lung parenchyma) and neg for malig, neg Bact/ AFB/ Fungi... 09/2014> started on Pred 09/29/14 at 40=>30 & tol well... 10/29/14> f/u CXR 10/29/14 w/ stable/ perhaps mild improvement, & ACE down to 97; Rec to keep Pred30 thru 8/15, then decr to 15m/d til ret...  Abn LFTs (AlkPhos> GOT/GPT) w/ mod hepatomegaly and heterogenous enhancement (due to steatosis & numerous lesions)... Splenic enlargement too w/ innumerable low density lesions throughout... Extensive intra-abdominal adenopathy... 09/29/14> Labs prior to Pred showed AlkPhos=445, AST=66, ALT=74; started on Pred40=>30 & clinically improved... 10/29/14> Labs on Pred30 showed AlkPhos=320, AST=58, ALT=104; rec to keep Pred30 thru 11/24/14, then wean to 284md til return 12/29/14...  PLAN>>  Tol Pred satis and he has had a sl initial response- we will need to wean Pred slowly; Rec to continue Pred 3085m for 58mo62mou 8/15, then decr to 20mg64mntil ROV 9/19... Remember- no salt, no sugars, low fat     Plan:     Patient's Medications  New Prescriptions   No medications on file  Previous Medications   OXYCODONE (ROXICODONE) 5 MG IMMEDIATE RELEASE TABLET    Take 1 tablet (5 mg total) by mouth every 4 (four) hours as needed for severe pain.   PREDNISONE (DELTASONE) 20 MG TABLET    Take as directed    Modified Medications   No medications on file  Discontinued Medications   No medications on file

## 2014-11-04 ENCOUNTER — Other Ambulatory Visit: Payer: Self-pay | Admitting: Pulmonary Disease

## 2014-12-29 ENCOUNTER — Ambulatory Visit: Payer: BLUE CROSS/BLUE SHIELD | Admitting: Pulmonary Disease

## 2015-01-19 ENCOUNTER — Ambulatory Visit (INDEPENDENT_AMBULATORY_CARE_PROVIDER_SITE_OTHER): Payer: BLUE CROSS/BLUE SHIELD | Admitting: Pulmonary Disease

## 2015-01-19 ENCOUNTER — Other Ambulatory Visit (INDEPENDENT_AMBULATORY_CARE_PROVIDER_SITE_OTHER): Payer: BLUE CROSS/BLUE SHIELD

## 2015-01-19 ENCOUNTER — Encounter: Payer: Self-pay | Admitting: Pulmonary Disease

## 2015-01-19 ENCOUNTER — Ambulatory Visit (INDEPENDENT_AMBULATORY_CARE_PROVIDER_SITE_OTHER)
Admission: RE | Admit: 2015-01-19 | Discharge: 2015-01-19 | Disposition: A | Discharge status: Home / Self Care | Payer: BLUE CROSS/BLUE SHIELD | Source: Ambulatory Visit | Attending: Pulmonary Disease | Admitting: Pulmonary Disease

## 2015-01-19 VITALS — BP 98/60 | HR 77 | Wt 209.2 lb

## 2015-01-19 DIAGNOSIS — R591 Generalized enlarged lymph nodes: Secondary | ICD-10-CM

## 2015-01-19 DIAGNOSIS — D869 Sarcoidosis, unspecified: Secondary | ICD-10-CM | POA: Diagnosis not present

## 2015-01-19 DIAGNOSIS — R162 Hepatomegaly with splenomegaly, not elsewhere classified: Secondary | ICD-10-CM | POA: Diagnosis not present

## 2015-01-19 LAB — BASIC METABOLIC PANEL
BUN: 12 mg/dL (ref 6–23)
CO2: 31 mEq/L (ref 19–32)
CREATININE: 0.88 mg/dL (ref 0.40–1.50)
Calcium: 9 mg/dL (ref 8.4–10.5)
Chloride: 103 mEq/L (ref 96–112)
GFR: 124.68 mL/min (ref >60.00)
Glucose, Bld: 143 mg/dL — ABNORMAL HIGH (ref 70–99)
Potassium: 3.9 mEq/L (ref 3.5–5.1)
SODIUM: 140 meq/L (ref 135–145)

## 2015-01-19 LAB — HEPATIC FUNCTION PANEL
ALK PHOS: 278 U/L — AB (ref 39–117)
ALT: 62 U/L — ABNORMAL HIGH (ref 0–53)
AST: 41 U/L — ABNORMAL HIGH (ref 0–37)
Albumin: 3.9 g/dL (ref 3.5–5.2)
BILIRUBIN DIRECT: 0.1 mg/dL (ref 0.0–0.3)
Total Bilirubin: 0.7 mg/dL (ref 0.2–1.2)
Total Protein: 7.4 g/dL (ref 6.0–8.3)

## 2015-01-19 NOTE — Patient Instructions (Signed)
Today we updated your med list in our EPIC system...    Continue your current medications the same...  Remember- whenever a question arises, just give [place] a call...  Today we did your follow up CXR & blood work...    We will contact you w/ the results when available...     We can then decide about restarting the Prednisone & at what dose...  When I call Dwayne [name] w/ the results we will decide about med Rx and when to return for follow up.Marland KitchenMarland Kitchen

## 2015-01-19 NOTE — Progress Notes (Addendum)
Subjective:     Patient ID: Dwayne [name], male   DOB: 17-Nov-1975, 39 y.o.   MRN: [id number]                                    CALL [name] @ [phone]  HPI ~  July 30, 2014:  Initial pulmonary consult by SN>  39 y/o BM, referred by Dr. 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 Dwayne [name] has enjoyed excellent general medical health & his only medication is Benedryl prn;  About 55mo ago he noted itching & decrease in appetite assoc w/ nausea & a 50 lb wt loss ?etiology; he was treated w/ Pred & Atarax & the itching improved; Labs showed elev LFTs w/ AlkPhos=636, GOT=68, GPT=62, Sed=55 (other Chems/ CBC/ TSH all wnl); repeat labs 2/16 were similar (plus Hep screen was neg) & he had AbdSonar 3/16 showing no gallstones, norm CBD, cystic lesion in pancreatic head, nonobstructing stone in right kidney; f/u CT Abd was surprising- no pancreatic lesion seen but he had mod hepatomegaly/ steatosis/ & heterogeneous enhancement (due to liver lesions), innumerable low-density splenic lesions, extensive abdominal adenopathy, bilat punctate renal collecting system calculi, and visualize lung bases w/ L>R nodularity & fibrosis & adenopathy seen...  PMHx, FamHx, SocHx are non-contributory; Married, one son, and he is a smoker- <1/2ppd, <10 pack-year history overall; and he denies cough, sput, hemoptysis, f/c/s, CP, etc...      EXAM reveals Afeb, VSS, O2sat=99% on RA at rest;  HEENT- neg, mallampati 2;  Chest- clear w/o w/r/r;  Heart- RR w/o m/r/g;  Ext- neg w/o c/c/e; no palp adenopathy...  CXR>  (he forgot to go to [facility] for this baseline film)  High- Res CT Chest w/ contrast>  DONE 08/04/14> bilat hilar & mediastinal adenopathy including 1.8cm subcarinal node; diffuse peribronchovasc nodularity w/ assoc mild consolid in LLL; heterogeneous liver and splenic nodularity...  Full PFTs w/ lung volumes and DLCO>  DONE 08/04/14> FVC=3.56 (89%), FEV1=2.87 (87%),  %1sec=81, mid-flows normal at 86% predicted;  TLC=4.03 (63%), RV=?0.39 (24%)?;  DLCO=?130% predicted?   Additional Labs w/ ACE level> Chems- wnl;  ACE>100 (8-52)...        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 Once we get to see these studies we will contact the pt regarding the best approach to Bx for tissue diagnosis, only then can we formulate a treatment plan...  ADDENDUM>>  All data collected is concordant for the diagnosis of Sarcoidosis- CT Chest shows BHA, mediastinal adenopathy, and parenchymal nodularity- making it likely that we can obtain a diagnositic Bx from lung tissue &/or mediatinal LN => we will arrange for Bronchoscopy w/ Bx...   ADDENDUM 08/13/14>> Bronch w/ TBBx by [facility] showed norm appearing airways and TBBx were surprizingly NEG- no granulomatous dis, no malig, neg Bact/ AFB/ Fungi... Decision made to proceed w/ Mediastinoscopy for node Bx... ADDENDUM 09/25/14>> B&M by DrBartle w/ benign LNs and +non-caseating granulomas consistent w/ sarcoidosis...   ~  September 29, 2014:  38mo ROV w/ SN>  follow-up after recent  outpt B&M by DrBartle>  Nodes were pos for non-caseating granulomas consistent w/ sarcoidosis;  We discussed treatment w/ PREDNISONE & pt admonished to elim sodium, no sugars/ sweets, etc... We will start Pred20- 2tabs Qam for about 2 weeks, then decrease to 30mg  daily til ret in 1 month when we will recheck CXR & labs... EXAM shows Afeb, VSS, O2sat=99% on RA:  HEENT- neg;  Chest- clear w/o w/r/r;  Mediastinoscopy scar healing well;  Heart- RR w/o m/r/g;  Ext- w/o c/c/e...   CXR 09/25/14 showed diffuse nodular interstitial dis w/ mild adenopathy, no  change...   LABS 6/16:  Chems- ok x elev LFTs- AlkPhos=445, GOT=66, GPT=77;  CBC- wnl;    ~  October 29, 2014:  38mo ROV w/ SN>  Dwayne [name] has been on the Pred as planned- 40mg /d x2wks, then 30mg /d til return; he feels OK- about the same- no cough, sput, hemoptysis, SOB, appetite better & he's gained 7# to 190# today; no further GI symptoms- w/o n/v/ itching etc... Due for f/u CXR & labs>     Sarcoidosis w/ lung, reticuloendothelial, & visceral involvement> started on Pred 6/16- 40=>30 w/ f/u CXR labs today    Bilat punctate renal collecting system calculi> aware EXAM shows Afeb, VSS, O2sat=99% on RA:  HEENT- neg;  Chest- clear w/o w/r/r;  Mediastinoscopy scar healing well & sm stitch removed;  Heart- RR w/o m/r/g;  Ext- w/o c/c/e...  CXR 10/29/14 showed norm heart size & stable subtle diffuse nodularity; to my review the diffuse LD/nodularity is sl better overall...  LABS 7/16:  Chems- ok x K=3.3, BS=105;  LFTs- sl improved AlkPhos & AST (ALT is sl worse);  ACE improved to <100 at 97 today (norm<52). IMP/PLAN>>  Tol Pred satis and he has had a sl initial response- we will need to wean Pred slowly; Rec to continue Pred 30mg /d for 20mo thru 8/15, then decr to 20mg /d until ROV 9/19... Remember- no salt, no sugars, low fat!  ~  January 19, 2015:  2-38mo ROV w/ SN>  Dwayne [name] missed his ROV 12/29/14 after weaning Pred as directed to 20mg /d; he has returned to driving long distance & couldn't resched the appt til today; rather than continue on the Pred 20mg /d til this return OV he mistakenly STOPPED the Pred 9/19 (about 3 weeks ago); despite this he tells me that he feels good & is asymptomatic w/o cough, sputum, SOB, chest discomfort, etc- he feels back to normal he says & hasn't noted any difficulty loading or unloading his truck... Due for f/u CXR & Labs>     Sarcoidosis w/ lung, reticuloendothelial, & visceral involvement> Initial eval w/ diffusely abn CXR/ CTChest w/ ACE>100 & abn LFTs; started on Pred 6/16- 40=>30  w/ f/u 7/20 showing sl improvement & ACE=97 w/ improved LFTs; we weaned the Pred 7/20 w/ 30mg /d thru 8/15, then 20mg /d til return 9/19; he missed this appt & stopped the Pred on his own 9/19; ROV 10/10 showed     Bilat punctate renal collecting system calculi> aware EXAM shows Afeb, VSS, O2sat=98% on RA:  HEENT- neg;  Chest- clear w/o w/r/r;  Mediastinoscopy scar healing well;  Heart- RR w/o m/r/g;  Ext- w/o c/c/e...  CXR 01/19/15 showed marked improvement w/ normal heart size, essentially clear lungs, NAD...  LABS 01/19/15> Chems- ok x BS=143;  LFTs- further improved w/ AlkPhos=278, GOT=41, GPT=62;  ACE level=85  IMP/PLAN>>  Improving Sarcoid inflammation even w/ the 3wk hiatus off the med;  I rec restarting Prednisone 20mg /d for  October, then decrease to $RemoveBef'10mg'IsuFdWmqVW$ /d starting in Nov & stay on this dose til ROV in 32mo (mid-Dec) at which time we will repeat his Labs; watch appetite, low carb diet, incr exercise, do not gain any more wt!    Past Medical History  Diagnosis Date  . Allergic rhinitis     Past Surgical History  Procedure Laterality Date  . Vasectomy      2002  . Video bronchoscopy Bilateral 08/13/2014    Procedure: VIDEO BRONCHOSCOPY WITH FLUORO;  Surgeon: Juanito Doom, MD;  Location: [facility];  Service: Cardiopulmonary;  Laterality: Bilateral;  . Flexible bronchoscopy N/A 09/25/2014    Procedure: FLEXIBLE BRONCHOSCOPY;  Surgeon: Gaye Pollack, MD;  Location: [facility];  Service: Thoracic;  Laterality: N/A;  . Mediastinoscopy N/A 09/25/2014    Procedure: MEDIASTINOSCOPY;  Surgeon: Gaye Pollack, MD;  Location: [facility];  Service: Thoracic;  Laterality: N/A;    Outpatient Encounter Prescriptions as of 01/19/2015  Medication Sig  . oxyCODONE (ROXICODONE) 5 MG immediate release tablet Take 1 tablet (5 mg total) by mouth every 4 (four) hours as needed for severe pain. (Patient not taking: Reported on 09/29/2014)  . predniSONE (DELTASONE) 20 MG tablet TAKE AS DIRECTED (Patient not  taking: Reported on 01/19/2015)   No facility-administered encounter medications on file as of 01/19/2015.    No Known Allergies   Current Medications, Allergies, Past Medical History, Past Surgical History, Family History, and Social History were reviewed in [employer] record.   Review of Systems         All symptoms NEG except when BOLDED >>  Constitutional:  Denies F/C/S, appetite is back to normal & weight is up ~20lbs HEENT:  No HA, visual changes, earache, nasal symptoms, sore throat, hoarseness. Resp:  No cough, sputum, hemoptysis; no SOB, tightness, wheezing. Cardio:  No CP, palpit, DOE, orthopnea, edema. GI:  w/o N/V/D/C or blood in stool; no reflux, abd pain, distention, or gas. GU:  No dysuria, freq, urgency, hematuria, or flank pain. MS:  Denies joint pain, swelling, tenderness, or decr ROM; no neck pain, back pain, etc. Neuro:  No tremors, seizures, dizziness, syncope, weakness, numbness, gait abn. Skin:  No suspicious lesions or skin rash, prev itching resolved... Heme:  No adenopathy, no bruising/ bleeding. Psyche: Denies confusion, sleep disturbance, hallucinations, anxiety, depression.   Objective:   Physical Exam    Vital Signs:  Reviewed...  General:  WD, WN, 39 y/o BM in NAD; alert & oriented; pleasant & cooperative... HEENT:  Pelzer/AT; Conjunctiva- pink, Sclera- nonicteric, EOM-wnl, PERRLA, EACs-clear, TMs-wnl; NOSE-clear; THROAT-clear & wnl. Neck:  Supple w/ full ROM; no JVD; normal carotid impulses w/o bruits; no thyromegaly or nodules palpated; no lymphadenopathy. Chest:  Clear to P & A; without wheezes, rales, or rhonchi heard. Heart:  Regular Rhythm; norm S1 & S2 without murmurs, rubs, or gallops detected. Abdomen:  Soft w/ min epig tenderness- no guarding or rebound; normal bowel sounds; liver edge palpated, no masses felt... Ext:  Normal ROM; without deformities or arthritic changes; no varicose veins, venous insuffic, or edema;   Pulses intact w/o bruits. Neuro:  CNs II-XII intact; motor testing normal; sensory testing normal; gait normal & balance OK. Derm:  No lesions noted; no rash etc. Lymph:  No cervical, supraclavicular, axillary, or inguinal adenopathy palpated.   Assessment:         Diffuse SARCOIDOSIS with ACE level 4/16 >100,  7/16=97,  10/16=85    Reticulonodular lung disease, hilar & mediastinal  node involvement => CXR 10/16 is clear on Pred rx.    Bronch & Mediastinoscopy 6/16 pos for noncaseating granulomas in nodes (scant in lung parenchyma) and neg for malig, neg Bact/ AFB/ Fungi... 09/2014> started on Pred 09/29/14 at 40=>30 & tol well... 10/29/14> f/u CXR 10/29/14 w/ stable/ perhaps mild improvement, & ACE down to 97; Rec to keep Pred30 thru 8/15, then decr to 20mg /d til ret... 01/19/15> Pt missed rov 9/19 & mistakenly STOPPED the Pred- now out x3wks but feels well; CXR is now clear, LFTs further improved, ACE=85; Restart Pred20=>10mg /d w/ ROV 5mo.     Abn LFTs (AlkPhos> GOT/GPT) w/ mod hepatomegaly and heterogenous enhancement (due to steatosis & numerous lesions)...    Splenic enlargement too w/ innumerable low density lesions throughout...    Extensive intra-abdominal adenopathy... 09/29/14> Labs prior to Pred showed AlkPhos=445, AST=66, ALT=74; started on Pred40=>30 & clinically improved... 10/29/14> Labs on Pred30 showed AlkPhos=320, AST=58, ALT=104; rec to keep Pred30 thru 11/24/14, then wean to 20mg /d til return 12/29/14... 01/19/15> Pt missed rov 9/19 & mistakenly STOPPED the Pred- now ot x3wks but feels well;  Labs showed AlkPhos=278, GOT=41, GPT=62 & we restarted Pred20=>10mg /d w/ rov 31mo...     Plan:     Patient's Medications  New Prescriptions        PREDNISONE 20mg  Tabs >> Take one tab daily for Oct, decrease to 10mg  daily starting in Nov, and stay on 10mg /d til return OV in Dec...  Previous Medications   OXYCODONE (ROXICODONE) 5 MG IMMEDIATE RELEASE TABLET    Take 1 tablet (5 mg total) by  mouth every 4 (four) hours as needed for severe pain.   PREDNISONE (DELTASONE) 20 MG TABLET    TAKE AS DIRECTED  Modified Medications   No medications on file  Discontinued Medications   No medications on file

## 2015-01-20 LAB — ANGIOTENSIN CONVERTING ENZYME: Angiotensin-Converting Enzyme: 85 U/L — ABNORMAL HIGH (ref 8–52)

## 2015-04-27 ENCOUNTER — Other Ambulatory Visit (INDEPENDENT_AMBULATORY_CARE_PROVIDER_SITE_OTHER): Payer: BLUE CROSS/BLUE SHIELD

## 2015-04-27 ENCOUNTER — Ambulatory Visit (INDEPENDENT_AMBULATORY_CARE_PROVIDER_SITE_OTHER): Payer: BLUE CROSS/BLUE SHIELD | Admitting: Pulmonary Disease

## 2015-04-27 ENCOUNTER — Encounter: Payer: Self-pay | Admitting: Pulmonary Disease

## 2015-04-27 VITALS — BP 140/82 | HR 80 | Temp 98.2°F | Wt 217.4 lb

## 2015-04-27 DIAGNOSIS — D869 Sarcoidosis, unspecified: Secondary | ICD-10-CM | POA: Diagnosis not present

## 2015-04-27 LAB — CBC WITH DIFFERENTIAL/PLATELET
BASOS PCT: 0.6 % (ref 0.0–3.0)
Basophils Absolute: 0 10*3/uL (ref 0.0–0.1)
EOS ABS: 0.1 10*3/uL (ref 0.0–0.7)
EOS PCT: 1.9 % (ref 0.0–5.0)
HCT: 45.6 % (ref 39.0–52.0)
Hemoglobin: 15.2 g/dL (ref 13.0–17.0)
Lymphocytes Relative: 38.5 % (ref 12.0–46.0)
Lymphs Abs: 1.8 10*3/uL (ref 0.7–4.0)
MCHC: 33.3 g/dL (ref 30.0–36.0)
MCV: 90.4 fl (ref 78.0–100.0)
MONO ABS: 0.4 10*3/uL (ref 0.1–1.0)
Monocytes Relative: 9.6 % (ref 3.0–12.0)
Neutro Abs: 2.3 10*3/uL (ref 1.4–7.7)
Neutrophils Relative %: 49.4 % (ref 43.0–77.0)
PLATELETS: 306 10*3/uL (ref 150.0–400.0)
RBC: 5.05 Mil/uL (ref 4.22–5.81)
RDW: 13.9 % (ref 11.5–15.5)
WBC: 4.6 10*3/uL (ref 4.0–10.5)

## 2015-04-27 LAB — HEPATIC FUNCTION PANEL
ALT: 83 U/L — ABNORMAL HIGH (ref 0–53)
AST: 44 U/L — AB (ref 0–37)
Albumin: 4.2 g/dL (ref 3.5–5.2)
Alkaline Phosphatase: 273 U/L — ABNORMAL HIGH (ref 39–117)
BILIRUBIN DIRECT: 0.2 mg/dL (ref 0.0–0.3)
BILIRUBIN TOTAL: 0.7 mg/dL (ref 0.2–1.2)
Total Protein: 7.6 g/dL (ref 6.0–8.3)

## 2015-04-27 LAB — BASIC METABOLIC PANEL
BUN: 15 mg/dL (ref 6–23)
CO2: 25 mEq/L (ref 19–32)
Calcium: 9.3 mg/dL (ref 8.4–10.5)
Chloride: 106 mEq/L (ref 96–112)
Creatinine, Ser: 0.84 mg/dL (ref 0.40–1.50)
GFR: 131.37 mL/min (ref >60.00)
Glucose, Bld: 78 mg/dL (ref 70–99)
POTASSIUM: 3.6 meq/L (ref 3.5–5.1)
Sodium: 145 mEq/L (ref 135–145)

## 2015-04-27 NOTE — Progress Notes (Signed)
Subjective:     Patient ID: Dwayne [name], male   DOB: March 10, 1976, 40 y.o.   MRN: [id number]                                    CALL [name] @ [phone]  HPI ~  July 30, 2014:  Initial pulmonary consult by SN>  6 y/o BM, referred by Dr. 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 Dwayne [name] has enjoyed excellent general medical health & his only medication is Benedryl prn;  About 61moago he noted itching & decrease in appetite assoc w/ nausea & a 50 lb wt loss ?etiology; he was treated w/ Pred & Atarax & the itching improved; Labs showed elev LFTs w/ AlkPhos=636, GOT=68, GPT=62, Sed=55 (other Chems/ CBC/ TSH all wnl); repeat labs 2/16 were similar (plus Hep screen was neg) & he had AbdSonar 3/16 showing no gallstones, norm CBD, cystic lesion in pancreatic head, nonobstructing stone in right kidney; f/u CT Abd was surprising- no pancreatic lesion seen but he had mod hepatomegaly/ steatosis/ & heterogeneous enhancement (due to liver lesions), innumerable low-density splenic lesions, extensive abdominal adenopathy, bilat punctate renal collecting system calculi, and visualize lung bases w/ L>R nodularity & fibrosis & adenopathy seen...  PMHx, FamHx, SocHx are non-contributory; Married, one son, and he is a smoker- <1/2ppd, <10 pack-year history overall; and he denies cough, sput, hemoptysis, f/c/s, CP, etc...      EXAM reveals Afeb, VSS, O2sat=99% on RA at rest;  HEENT- neg, mallampati 2;  Chest- clear w/o w/r/r;  Heart- RR w/o m/r/g;  Ext- neg w/o c/c/e; no palp adenopathy...  [facility]>  [facility]  High- Res CT Chest w/ contrast>  DONE 08/04/14> bilat hilar & mediastinal adenopathy including 1.8cm subcarinal node; diffuse peribronchovasc nodularity w/ assoc mild consolid in LLL; heterogeneous liver and splenic nodularity...  Full PFTs w/ lung volumes and DLCO>  DONE 08/04/14> FVC=3.56 (89%), FEV1=2.87 (87%),  %1sec=81, mid-flows normal at 86% predicted;  TLC=4.03 (63%), RV=?0.39 (24%)?;  DLCO=?130% predicted?   Additional Labs w/ ACE level> Chems- wnl;  ACE>100 (8-52)...        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 Once we get to see these studies we will contact the pt regarding the best approach to Bx for tissue diagnosis, only then can we formulate a treatment plan...  ADDENDUM>>  All data collected is concordant for the diagnosis of Sarcoidosis- CT Chest shows BHA, mediastinal adenopathy, and parenchymal nodularity- making it likely that we can obtain a diagnositic Bx from lung tissue &/or mediatinal LN => we will arrange for Bronchoscopy w/ Bx...   ADDENDUM 08/13/14>> [facility] and TBBx were surprizingly NEG- no granulomatous dis, no malig, neg Bact/ AFB/ Fungi... Decision made to proceed w/ Mediastinoscopy for node Bx... ADDENDUM 09/25/14>> B&M by DrBartle w/ benign LNs and +non-caseating granulomas consistent w/ sarcoidosis...   ~  September 29, 2014:  296moOV w/ SN>  follow-up after recent  outpt B&M by DrBartle>  Nodes were pos for non-caseating granulomas consistent w/ sarcoidosis;  We discussed treatment w/ PREDNISONE & pt admonished to elim sodium, no sugars/ sweets, etc... We will start Pred20- 2tabs Qam for about 2 weeks, then decrease to 54m daily til ret in 1 month when we will recheck CXR & labs... EXAM shows Afeb, VSS, O2sat=99% on RA:  HEENT- neg;  Chest- clear w/o w/r/r;  Mediastinoscopy scar healing well;  Heart- RR w/o m/r/g;  Ext- w/o c/c/e...   CXR 09/25/14 showed diffuse nodular interstitial dis w/ mild adenopathy, no  change...   LABS 6/16:  Chems- ok x elev LFTs- AlkPhos=445, GOT=66, GPT=77;  CBC- wnl;    ~  October 29, 2014:  177moOV w/ SN>  [name] on the Pred as planned- 4013m x2wks, then 64m49mtil return; he feels OK- about the same- no cough, sput, hemoptysis, SOB, appetite better & he's gained 7# to 190# today; no further GI symptoms- w/o n/v/ itching etc... Due for f/u CXR & labs>     Sarcoidosis w/ lung, reticuloendothelial, & visceral involvement> started on Pred 6/16- 40=>30 w/ f/u CXR labs today    Bilat punctate renal collecting system calculi> aware EXAM shows Afeb, VSS, O2sat=99% on RA:  HEENT- neg;  Chest- clear w/o w/r/r;  Mediastinoscopy scar healing well & sm stitch removed;  Heart- RR w/o m/r/g;  Ext- w/o c/c/e...  CXR 10/29/14 showed norm heart size & stable subtle diffuse nodularity; to my review the diffuse LD/nodularity is sl better overall...  LABS 7/16:  Chems- ok x K=3.3, BS=105;  LFTs- sl improved AlkPhos & AST (ALT is sl worse);  ACE improved to <100 at 97 today (norm<52). IMP/PLAN>>  Tol Pred satis and he has had a sl initial response- we will need to wean Pred slowly; Rec to continue Pred 64mg4mor 16mo t516mo8/15, then decr to 20mg/d8mil ROV 9/19... Remember- no salt, no sugars, low fat!  ~  January 19, 2015:  2-69mo ROV49moSN>  Kadrian [name] ROV 12/29/14 after weaning Pred as directed to 20mg/d; 36mas returned to driving long distance & couldn't resched the appt til today; rather than continue on the Pred 20mg/d ti38mis return OV he mistakenly STOPPED the Pred 9/19 (about 3 weeks ago); despite this he tells me that he feels good & is asymptomatic w/o cough, sputum, SOB, chest discomfort, etc- he feels back to normal he says & hasn't noted any difficulty loading or unloading his truck... Due for f/u CXR & Labs>     Sarcoidosis w/ lung, reticuloendothelial, & visceral involvement> Initial eval w/ diffusely abn CXR/ CTChest w/ ACE>100 & abn LFTs; started on Pred 6/16- 40=>30  w/ f/u 10/29/14 showing sl improvement & ACE=97 w/ improved LFTs; we weaned the Pred 10/29/14 w/ 64mg/d thr20m15, then 20mg/d til 80mrn 9/19; he missed this appt & stopped the Pred on his own 9/19; ROV 10/10 showed     Bilat punctate renal collecting system calculi> aware EXAM shows Afeb, VSS, O2sat=98% on RA:  HEENT- neg;  Chest- clear w/o w/r/r;  Mediastinoscopy scar healing well;  Heart- RR w/o m/r/g;  Ext- w/o c/c/e...  CXR 01/19/15 showed marked improvement w/ normal heart size, essentially clear lungs, NAD...  LABS 01/19/15> Chems- ok x BS=143;  LFTs- further improved w/ AlkPhos=278, GOT=41, GPT=62;  ACE level=85  IMP/PLAN>>  Improving Sarcoid inflammation even w/ the 3wk hiatus off the med;  I rec restarting Prednisone 20mg/d for2m  October, then decrease to 54m/d starting in Nov & stay on this dose til ROV in 248momid-Dec) at which time we will repeat his Labs; watch appetite, low carb diet, incr exercise, do not gain any more wt!   ~  April 27, 2015:  26m6moV w/ SN>  JasMadyxports that he has been taking the Pred10m76mily every day, he is upset about the weight gain (up 8# to 217#) & we reviewed diet/ exercise/ wt reduction strategies;  He feels well, breathing is good- denies SOB, min cough, sl beige sput, no blood, no CP, etc; no new complaints or concerns... EXAM shows Afeb, VSS, O2sat=98% on RA:  HEENT- neg, mallampati2;  Chest- clear w/o w/r/r;  Mediastinoscopy scar healing well;  Heart- RR w/o m/r/g;  Abd- soft, nontender, neg;  Ext- w/o c/c/e;  Neuro- intact...  Labs 04/27/15>  Chems- wnl w/ BS=78; LFTs w/ persistent AlkPhos=273, AST=44, ALT=83 & reminded no hepatotoxins; CBC- wnl;  ACE=57, improved...  IMP/PLAN>>  We reviewed the need for diet, exercise, get the weight down;  Avoid all hepatotoxins including Etoh;  OK to inch the Pred lower from 10mg6mly to 10mg 42mrnating w/ 5mg Qo86mil return in 3 mo...    Past Medical History  Diagnosis Date  . Allergic rhinitis     Past  Surgical History  Procedure Laterality Date  . Vasectomy      2002  . Video bronchoscopy Bilateral 08/13/2014    Procedure: VIDEO BRONCHOSCOPY WITH FLUORO;  Surgeon: DouglasJuanito DoomLocation: [facility] ENDOW[facility]sonice: Cardiopulmonary;  Laterality: Bilateral;  . Flexible bronchoscopy N/A 09/25/2014    Procedure: FLEXIBLE BRONCHOSCOPY;  Surgeon: Bryan KGaye PollackLocation: [facility] OR; [place];  Laterality: N/A;  . Mediastinoscopy N/A 09/25/2014    Procedure: MEDIASTINOSCOPY;  Surgeon: Bryan KGaye PollackLocation: [facility] OR;  Service: Thoracic;  Laterality: N/A;    Outpatient Encounter Prescriptions as of 04/27/2015  Medication Sig  . predniSONE (DELTASONE) 10 MG tablet Take 10 mg by mouth daily with breakfast.  . [DISCONTINUED] oxyCODONE (ROXICODONE) 5 MG immediate release tablet Take 1 tablet (5 mg total) by mouth every 4 (four) hours as needed for severe pain. (Patient not taking: Reported on 09/29/2014)  . [DISCONTINUED] predniSONE (DELTASONE) 20 MG tablet TAKE AS DIRECTED (Patient not taking: Reported on 01/19/2015)   No facility-administered encounter medications on file as of 04/27/2015.    No Known Allergies   Current Medications, Allergies, Past Medical History, Past Surgical History, Family History, and Social History were reviewed in [employer].   Review of Systems         All symptoms NEG except when BOLDED >>  Constitutional:  Denies F/C/S, appetite is back to normal & weight is up ~20lbs HEENT:  No HA, visual changes, earache, nasal symptoms, sore throat, hoarseness. Resp:  No cough, sputum, hemoptysis; no SOB, tightness, wheezing. Cardio:  No CP, palpit, DOE, orthopnea, edema. GI:  w/o N/V/D/C or blood in stool; no reflux, abd pain, distention, or gas. GU:  No dysuria, freq, urgency, hematuria, or flank pain. MS:  Denies joint pain, swelling, tenderness, or decr ROM; no neck pain, back pain, etc. Neuro:  No tremors, seizures,  dizziness, syncope, weakness, numbness, gait abn. Skin:  No suspicious lesions or skin rash, prev itching resolved... Heme:  No adenopathy, no bruising/ bleeding. Psyche: Denies confusion, sleep disturbance, hallucinations, anxiety, depression.   Objective:   Physical Exam    Vital  Signs:  Reviewed...  General:  WD, WN, 40 y/o BM in NAD; alert & oriented; pleasant & cooperative... HEENT:  [place]/AT; Conjunctiva- pink, Sclera- nonicteric, EOM-wnl, PERRLA, EACs-clear, TMs-wnl; NOSE-clear; THROAT-clear & wnl. Neck:  Supple w/ full ROM; no JVD; normal carotid impulses w/o bruits; no thyromegaly or nodules palpated; no lymphadenopathy. Chest:  Clear to P & A; without wheezes, rales, or rhonchi heard. Heart:  Regular Rhythm; norm S1 & S2 without murmurs, rubs, or gallops detected. Abdomen:  Soft w/ min epig tenderness- no guarding or rebound; normal bowel sounds; liver edge palpated, no masses felt... Ext:  Normal ROM; without deformities or arthritic changes; no varicose veins, venous insuffic, or edema;  Pulses intact w/o bruits. Neuro:  CNs II-XII intact; motor testing normal; sensory testing normal; gait normal & balance OK. Derm:  No lesions noted; no rash etc. Lymph:  No cervical, supraclavicular, axillary, or inguinal adenopathy palpated.   Assessment:         Diffuse SARCOIDOSIS with ACE level 4/16 >100, started on Pred rx=>  7/16=97,  10/16=85,  1/17=57    Reticulonodular lung disease, hilar & mediastinal node involvement => CXR 10/16 is clear on Pred rx.    Bronch & Mediastinoscopy 6/16 pos for noncaseating granulomas in nodes (scant in lung parenchyma) and neg for malig, neg Bact/ AFB/ Fungi... 09/2014> started on Pred 09/29/14 at 40=>30 & tol well... 10/29/14> f/u CXR 10/29/14 w/ stable/ perhaps mild improvement, & ACE down to 97; Rec to keep Pred30 thru 8/15, then decr to 33m/d til ret... 01/19/15> Pt missed rov 9/19 & mistakenly STOPPED the Pred- now out x3wks but feels well; CXR is  now clear, LFTs further improved, ACE=85; Restart Pred20=>129md w/ ROV 78m44mo/16/17> On Pred10ACE is down to 57, but LFTs still sl elev (AlkPhos=273, AST=44, ALT=83); asked to stop all hepatotoxins & cut Pred to 19m81mt w/ 5mg 81m and ROV 78mo..36mo  Abn LFTs (AlkPhos> GOT/GPT) w/ mod hepatomegaly and heterogenous enhancement (due to steatosis & numerous lesions)...    Splenic enlargement too w/ innumerable low density lesions throughout...    Extensive intra-abdominal adenopathy... 09/29/14> Labs prior to Pred showed AlkPhos=445, AST=66, ALT=74; started on Pred40=>30 & clinically improved... 10/29/14> Labs on Pred30 showed AlkPhos=320, AST=58, ALT=104; rec to keep Pred30 thru 11/24/14, then wean to 20mg/d578m return 12/29/14... 01/19/15> Pt missed rov 9/19 & mistakenly STOPPED the Pred- now ot x3wks but feels well;  Labs showed AlkPhos=278, GOT=41, GPT=62 & we restarted Pred20=>19mg/d 278mov 78mo... 179mo17> On Pred10ACE is down to 57, but LFTs still sl elev (AlkPhos=273, AST=44, ALT=83); asked to stop all hepatotoxins & cut Pred to 19mg alt 66mmg Qod an63mOV 78mo...     20mo:     Patient's Medications  New Prescriptions   No medications on file  Previous Medications   PREDNISONE (DELTASONE) 10 MG TABLET    ALTERNATE one tab (19mg) with 157mab (5mg) every ot61m day until return OV...  Modified Medications   No medications on file  Discontinued Medications   OXYCODONE (ROXICODONE) 5 MG IMMEDIATE RELEASE TABLET    Take 1 tablet (5 mg total) by mouth every 4 (four) hours as needed for severe pain.   PREDNISONE (DELTASONE) 20 MG TABLET    TAKE AS DIRECTED

## 2015-04-27 NOTE — Patient Instructions (Signed)
Today we updated your med list in our EPIC system...    Continue your current medications the same...  Today we rechecked your blood work...    We will contact you w/ the results when available...   We will then determine how to adjust your PREDNISONE therapy...  Let's get on track w/ diet & exercise, work on weight reduction...  Call for any questions...  Let's plan a follow up visit in 12mo, sooner if needed for problems.Marland KitchenMarland Kitchen

## 2015-04-28 LAB — ANGIOTENSIN CONVERTING ENZYME: Angiotensin-Converting Enzyme: 57 U/L — ABNORMAL HIGH (ref 8–52)

## 2015-04-29 ENCOUNTER — Telehealth: Payer: Self-pay | Admitting: Pulmonary Disease

## 2015-04-29 NOTE — Telephone Encounter (Signed)
Spoke with pt's wife, requesting lab results from 04/27/15.  States that the lab results determine how much prednisone he will need to take. SN please advise.  Thanks!

## 2015-04-29 NOTE — Telephone Encounter (Signed)
lmtcb x1 for [name].

## 2015-04-29 NOTE — Telephone Encounter (Signed)
[name] returned call, may be reached at [phone]

## 2015-05-09 ENCOUNTER — Other Ambulatory Visit: Payer: Self-pay | Admitting: Pulmonary Disease

## 2015-07-27 ENCOUNTER — Ambulatory Visit: Payer: BLUE CROSS/BLUE SHIELD | Admitting: Pulmonary Disease

## 2015-07-27 ENCOUNTER — Encounter: Payer: Self-pay | Admitting: Pulmonary Disease

## 2015-07-27 ENCOUNTER — Ambulatory Visit (INDEPENDENT_AMBULATORY_CARE_PROVIDER_SITE_OTHER)
Admission: RE | Admit: 2015-07-27 | Discharge: 2015-07-27 | Disposition: A | Discharge status: Home / Self Care | Payer: BLUE CROSS/BLUE SHIELD | Source: Ambulatory Visit | Attending: Pulmonary Disease | Admitting: Pulmonary Disease

## 2015-07-27 ENCOUNTER — Other Ambulatory Visit (INDEPENDENT_AMBULATORY_CARE_PROVIDER_SITE_OTHER): Payer: BLUE CROSS/BLUE SHIELD

## 2015-07-27 VITALS — BP 142/88 | HR 73 | Temp 99.4°F | Ht 69.0 in | Wt 219.8 lb

## 2015-07-27 DIAGNOSIS — D869 Sarcoidosis, unspecified: Secondary | ICD-10-CM

## 2015-07-27 LAB — BASIC METABOLIC PANEL
BUN: 14 mg/dL (ref 6–23)
CO2: 28 mEq/L (ref 19–32)
Calcium: 9.7 mg/dL (ref 8.4–10.5)
Chloride: 102 mEq/L (ref 96–112)
Creatinine, Ser: 0.91 mg/dL (ref 0.40–1.50)
GFR: 119.62 mL/min (ref >60.00)
Glucose, Bld: 96 mg/dL (ref 70–99)
POTASSIUM: 3.9 meq/L (ref 3.5–5.1)
Sodium: 140 mEq/L (ref 135–145)

## 2015-07-27 LAB — HEPATIC FUNCTION PANEL
ALT: 66 U/L — AB (ref 0–53)
AST: 40 U/L — AB (ref 0–37)
Albumin: 4 g/dL (ref 3.5–5.2)
Alkaline Phosphatase: 312 U/L — ABNORMAL HIGH (ref 39–117)
Bilirubin, Direct: 0.2 mg/dL (ref 0.0–0.3)
Total Bilirubin: 0.7 mg/dL (ref 0.2–1.2)
Total Protein: 7.8 g/dL (ref 6.0–8.3)

## 2015-07-27 NOTE — Patient Instructions (Signed)
Today we updated your med list in our EPIC system...    Continue your current medications the same...  Today we checked your follow up CXR & blood work...     We will contact you w/ the results when available...   I anticipate that we will decrease the PREDNISONE to 5mg  daily once the labs return>    And we will recheck you on this dose in 4 months time...  Call for any questions or if we can be of service in any way.Marland KitchenMarland Kitchen

## 2015-07-28 ENCOUNTER — Encounter: Payer: Self-pay | Admitting: Pulmonary Disease

## 2015-07-28 LAB — ANGIOTENSIN CONVERTING ENZYME: Angiotensin-Converting Enzyme: >100 U/L — ABNORMAL HIGH (ref 8–52)

## 2015-07-28 NOTE — Progress Notes (Signed)
Subjective:     Patient ID: Dwayne [name], male   DOB: March 10, 1976, 40 y.o.   MRN: [id number]                                    CALL [name] @ [phone]  HPI ~  July 30, 2014:  Initial pulmonary consult by SN>  6 y/o BM, referred by Dr. 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 Dwayne [name] has enjoyed excellent general medical health & his only medication is Benedryl prn;  About 61moago he noted itching & decrease in appetite assoc w/ nausea & a 50 lb wt loss ?etiology; he was treated w/ Pred & Atarax & the itching improved; Labs showed elev LFTs w/ AlkPhos=636, GOT=68, GPT=62, Sed=55 (other Chems/ CBC/ TSH all wnl); repeat labs 2/16 were similar (plus Hep screen was neg) & he had AbdSonar 3/16 showing no gallstones, norm CBD, cystic lesion in pancreatic head, nonobstructing stone in right kidney; f/u CT Abd was surprising- no pancreatic lesion seen but he had mod hepatomegaly/ steatosis/ & heterogeneous enhancement (due to liver lesions), innumerable low-density splenic lesions, extensive abdominal adenopathy, bilat punctate renal collecting system calculi, and visualize lung bases w/ L>R nodularity & fibrosis & adenopathy seen...  PMHx, FamHx, SocHx are non-contributory; Married, one son, and he is a smoker- <1/2ppd, <10 pack-year history overall; and he denies cough, sput, hemoptysis, f/c/s, CP, etc...      EXAM reveals Afeb, VSS, O2sat=99% on RA at rest;  HEENT- neg, mallampati 2;  Chest- clear w/o w/r/r;  Heart- RR w/o m/r/g;  Ext- neg w/o c/c/e; no palp adenopathy...  [facility]>  [facility]  High- Res CT Chest w/ contrast>  DONE 08/04/14> bilat hilar & mediastinal adenopathy including 1.8cm subcarinal node; diffuse peribronchovasc nodularity w/ assoc mild consolid in LLL; heterogeneous liver and splenic nodularity...  Full PFTs w/ lung volumes and DLCO>  DONE 08/04/14> FVC=3.56 (89%), FEV1=2.87 (87%),  %1sec=81, mid-flows normal at 86% predicted;  TLC=4.03 (63%), RV=?0.39 (24%)?;  DLCO=?130% predicted?   Additional Labs w/ ACE level> Chems- wnl;  ACE>100 (8-52)...        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 Once we get to see these studies we will contact the pt regarding the best approach to Bx for tissue diagnosis, only then can we formulate a treatment plan...  ADDENDUM>>  All data collected is concordant for the diagnosis of Sarcoidosis- CT Chest shows BHA, mediastinal adenopathy, and parenchymal nodularity- making it likely that we can obtain a diagnositic Bx from lung tissue &/or mediatinal LN => we will arrange for Bronchoscopy w/ Bx...   ADDENDUM 08/13/14>> [facility] and TBBx were surprizingly NEG- no granulomatous dis, no malig, neg Bact/ AFB/ Fungi... Decision made to proceed w/ Mediastinoscopy for node Bx... ADDENDUM 09/25/14>> B&M by DrBartle w/ benign LNs and +non-caseating granulomas consistent w/ sarcoidosis...   ~  September 29, 2014:  296moOV w/ SN>  follow-up after recent  outpt B&M by DrBartle>  Nodes were pos for non-caseating granulomas consistent w/ sarcoidosis;  We discussed treatment w/ PREDNISONE & pt admonished to elim sodium, no sugars/ sweets, etc... We will start Pred20- 2tabs Qam for about 2 weeks, then decrease to 54m daily til ret in 1 month when we will recheck CXR & labs... EXAM shows Afeb, VSS, O2sat=99% on RA:  HEENT- neg;  Chest- clear w/o w/r/r;  Mediastinoscopy scar healing well;  Heart- RR w/o m/r/g;  Ext- w/o c/c/e...   CXR 09/25/14 showed diffuse nodular interstitial dis w/ mild adenopathy, no  change...   LABS 6/16:  Chems- ok x elev LFTs- AlkPhos=445, GOT=66, GPT=77;  CBC- wnl;    ~  October 29, 2014:  177moOV w/ SN>  Dwayne [name] been on the Pred as planned- 4013m x2wks, then 64m49mtil return; he feels OK- about the same- no cough, sput, hemoptysis, SOB, appetite better & he's gained 7# to 190# today; no further GI symptoms- w/o n/v/ itching etc... Due for f/u CXR & labs>     Sarcoidosis w/ lung, reticuloendothelial, & visceral involvement> started on Pred 6/16- 40=>30 w/ f/u CXR labs today    Bilat punctate renal collecting system calculi> aware EXAM shows Afeb, VSS, O2sat=99% on RA:  HEENT- neg;  Chest- clear w/o w/r/r;  Mediastinoscopy scar healing well & sm stitch removed;  Heart- RR w/o m/r/g;  Ext- w/o c/c/e...  CXR 10/29/14 showed norm heart size & stable subtle diffuse nodularity; to my review the diffuse LD/nodularity is sl better overall...  LABS 7/16:  Chems- ok x K=3.3, BS=105;  LFTs- sl improved AlkPhos & AST (ALT is sl worse);  ACE improved to <100 at 97 today (norm<52). IMP/PLAN>>  Tol Pred satis and he has had a sl initial response- we will need to wean Pred slowly; Rec to continue Pred 64mg4mor 16mo t516mo8/15, then decr to 20mg/d8mil ROV 9/19... Remember- no salt, no sugars, low fat!  ~  January 19, 2015:  2-69mo ROV49moSN>  Dwayne [name] ROV 12/29/14 after weaning Pred as directed to 20mg/d; 36mas returned to driving long distance & couldn't resched the appt til today; rather than continue on the Pred 20mg/d ti38mis return OV he mistakenly STOPPED the Pred 9/19 (about 3 weeks ago); despite this he tells me that he feels good & is asymptomatic w/o cough, sputum, SOB, chest discomfort, etc- he feels back to normal he says & hasn't noted any difficulty loading or unloading his truck... Due for f/u CXR & Labs>     Sarcoidosis w/ lung, reticuloendothelial, & visceral involvement> Initial eval w/ diffusely abn CXR/ CTChest w/ ACE>100 & abn LFTs; started on Pred 6/16- 40=>30  w/ f/u 10/29/14 showing sl improvement & ACE=97 w/ improved LFTs; we weaned the Pred 10/29/14 w/ 64mg/d thr20m15, then 20mg/d til 80mrn 9/19; he missed this appt & stopped the Pred on his own 9/19; ROV 10/10 showed     Bilat punctate renal collecting system calculi> aware EXAM shows Afeb, VSS, O2sat=98% on RA:  HEENT- neg;  Chest- clear w/o w/r/r;  Mediastinoscopy scar healing well;  Heart- RR w/o m/r/g;  Ext- w/o c/c/e...  CXR 01/19/15 showed marked improvement w/ normal heart size, essentially clear lungs, NAD...  LABS 01/19/15> Chems- ok x BS=143;  LFTs- further improved w/ AlkPhos=278, GOT=41, GPT=62;  ACE level=85  IMP/PLAN>>  Improving Sarcoid inflammation even w/ the 3wk hiatus off the med;  I rec restarting Prednisone 20mg/d for2m  October, then decrease to 4m/d starting in Nov & stay on this dose til ROV in 265momid-Dec) at which time we will repeat his Labs; watch appetite, low carb diet, incr exercise, do not gain any more wt!   ~  April 27, 2015:  92m54moV w/ SN>  Dwayne [name] that he has been taking the Pred10m42mily every day, he is upset about the weight gain (up 8# to 217#) & we reviewed diet/ exercise/ wt reduction strategies;  He feels well, breathing is good- denies SOB, min cough, sl beige sput, no blood, no CP, etc; no new complaints or concerns... EXAM shows Afeb, VSS, O2sat=98% on RA:  HEENT- neg, mallampati2;  Chest- clear w/o w/r/r;  Mediastinoscopy scar healing well;  Heart- RR w/o m/r/g;  Abd- soft, nontender, neg;  Ext- w/o c/c/e;  Neuro- intact...  Labs 04/27/15>  Chems- wnl w/ BS=78; LFTs w/ persistent AlkPhos=273, AST=44, ALT=83 & reminded no hepatotoxins; CBC- wnl;  ACE=57, improved...  IMP/PLAN>>  We reviewed the need for diet, exercise, get the weight down;  Avoid all hepatotoxins including Etoh;  OK to inch the Pred lower from 10mg9mly to 10mg 692mrnating w/ 5mg Qo37mil return in 3 mo...  ~  July 27, 2015:  92mo ROV47moSN>         Past Medical History    Diagnosis Date  . Allergic rhinitis     Past Surgical History  Procedure Laterality Date  . Vasectomy      2002  . Video bronchoscopy Bilateral 08/13/2014    Procedure: VIDEO BRONCHOSCOPY WITH FLUORO;  Surgeon: Douglas Juanito Doomocation: [facility] ENDOSHugotonce: Cardiopulmonary;  Laterality: Bilateral;  . Flexible bronchoscopy N/A 09/25/2014    Procedure: FLEXIBLE BRONCHOSCOPY;  Surgeon: Bryan K Gaye Pollackocation: [facility] OR;  [place]: Thoracic;  Laterality: N/A;  . Mediastinoscopy N/A 09/25/2014    Procedure: MEDIASTINOSCOPY;  Surgeon: Bryan K Gaye Pollackocation: [facility] OR;  Service: Thoracic;  Laterality: N/A;    Outpatient Encounter Prescriptions as of 07/27/2015  Medication Sig  . loratadine (CLARITIN) 10 MG tablet Take 10 mg by mouth daily.  . predniSONE (DELTASONE) 10 MG tablet TAKE 1 TABLET EVERY OTHER DAY AND 1/2 TABLET EVERY OTHER DAY   No facility-administered encounter medications on file as of 07/27/2015.    No Known Allergies   Current Medications, Allergies, Past Medical History, Past Surgical History, Family History, and Social History were reviewed in [facility]   Review of Systems         All symptoms NEG except when BOLDED >>  Constitutional:  Denies F/C/S, appetite is back to normal & weight is up ~20lbs HEENT:  No HA, visual changes, earache, nasal symptoms, sore throat, hoarseness. Resp:  No cough, sputum, hemoptysis; no SOB, tightness, wheezing. Cardio:  No CP, palpit, DOE, orthopnea, edema. GI:  w/o N/V/D/C or blood in stool; no reflux, abd pain, distention, or gas. GU:  No dysuria, freq, urgency, hematuria, or flank pain. MS:  Denies joint pain, swelling, tenderness, or decr ROM; no neck pain, back pain, etc. Neuro:  No tremors, seizures, dizziness, syncope, weakness, numbness, gait abn. Skin:  No suspicious lesions or skin rash, prev itching resolved... Heme:  No adenopathy, no bruising/ bleeding. Psyche: Denies  confusion, sleep disturbance, hallucinations, anxiety, depression.   Objective:   Physical Exam    Vital Signs:  Reviewed...  General:  WD, WN, 39 y/o B42in NAD; alert & oriented;  pleasant & cooperative... HEENT:  [place]/AT; Conjunctiva- pink, Sclera- nonicteric, EOM-wnl, PERRLA, EACs-clear, TMs-wnl; NOSE-clear; THROAT-clear & wnl. Neck:  Supple w/ full ROM; no JVD; normal carotid impulses w/o bruits; no thyromegaly or nodules palpated; no lymphadenopathy. Chest:  Clear to P & A; without wheezes, rales, or rhonchi heard. Heart:  Regular Rhythm; norm S1 & S2 without murmurs, rubs, or gallops detected. Abdomen:  Soft w/ min epig tenderness- no guarding or rebound; normal bowel sounds; liver edge palpated, no masses felt... Ext:  Normal ROM; without deformities or arthritic changes; no varicose veins, venous insuffic, or edema;  Pulses intact w/o bruits. Neuro:  CNs II-XII intact; motor testing normal; sensory testing normal; gait normal & balance OK. Derm:  No lesions noted; no rash etc. Lymph:  No cervical, supraclavicular, axillary, or inguinal adenopathy palpated.   Assessment:         Diffuse SARCOIDOSIS with ACE level 4/16 >100, started on Pred rx=>  7/16=97,  10/16=85,  1/17=57    Reticulonodular lung disease, hilar & mediastinal node involvement => CXR 10/16 is clear on Pred rx.    Bronch & Mediastinoscopy 6/16 pos for noncaseating granulomas in nodes (scant in lung parenchyma) and neg for malig, neg Bact/ AFB/ Fungi... 09/2014> started on Pred 09/29/14 at 40=>30 & tol well... 10/29/14> f/u CXR 10/29/14 w/ stable/ perhaps mild improvement, & ACE down to 97; Rec to keep Pred30 thru 8/15, then decr to 19m/d til ret... 01/19/15> Pt missed rov 9/19 & mistakenly STOPPED the Pred- now out x3wks but feels well; CXR is now clear, LFTs further improved, ACE=85; Restart Pred20=>1371md w/ ROV 71m70mo/16/17> On Pred10ACE is down to 57, but LFTs still sl elev (AlkPhos=273, AST=44, ALT=83); asked to stop  all hepatotoxins & cut Pred to 22m59mt w/ 5mg 38m and ROV 28mo..18mo  Abn LFTs (AlkPhos> GOT/GPT) w/ mod hepatomegaly and heterogenous enhancement (due to steatosis & numerous lesions)...    Splenic enlargement too w/ innumerable low density lesions throughout...    Extensive intra-abdominal adenopathy... 09/29/14> Labs prior to Pred showed AlkPhos=445, AST=66, ALT=74; started on Pred40=>30 & clinically improved... 10/29/14> Labs on Pred30 showed AlkPhos=320, AST=58, ALT=104; rec to keep Pred30 thru 11/24/14, then wean to 20mg/d39m return 12/29/14... 01/19/15> Pt missed rov 9/19 & mistakenly STOPPED the Pred- now ot x3wks but feels well;  Labs showed AlkPhos=278, GOT=41, GPT=62 & we restarted Pred20=>22mg/d 61mov 71mo... 191mo17> On Pred10ACE is down to 57, but LFTs still sl elev (AlkPhos=273, AST=44, ALT=83); asked to stop all hepatotoxins & cut Pred to 22mg alt 44mmg Qod an66mOV 28mo...     71mo:     Patient's Medications  New Prescriptions   No medications on file  Previous Medications   PREDNISONE (DELTASONE) 10 MG TABLET    ALTERNATE one tab (22mg) with 144mab (5mg) every ot45m day until return OV...  Modified Medications   No medications on file  Discontinued Medications   OXYCODONE (ROXICODONE) 5 MG IMMEDIATE RELEASE TABLET    Take 1 tablet (5 mg total) by mouth every 4 (four) hours as needed for severe pain.   PREDNISONE (DELTASONE) 20 MG TABLET    TAKE AS DIRECTED

## 2015-11-04 ENCOUNTER — Encounter: Payer: Self-pay | Admitting: Pulmonary Disease

## 2015-11-30 ENCOUNTER — Ambulatory Visit: Payer: BLUE CROSS/BLUE SHIELD | Admitting: Pulmonary Disease

## 2015-12-09 ENCOUNTER — Ambulatory Visit (INDEPENDENT_AMBULATORY_CARE_PROVIDER_SITE_OTHER): Payer: Self-pay | Admitting: Physician Assistant

## 2015-12-09 VITALS — BP 124/80 | HR 79 | Temp 98.4°F | Resp 16 | Ht 68.5 in | Wt 221.8 lb

## 2015-12-09 DIAGNOSIS — Z0289 Encounter for other administrative examinations: Secondary | ICD-10-CM

## 2015-12-09 DIAGNOSIS — Z021 Encounter for pre-employment examination: Secondary | ICD-10-CM

## 2015-12-09 NOTE — Patient Instructions (Signed)
     IF you received an x-ray today, you will receive an invoice from [facility]. Please contact [facility] at [phone] with questions or concerns regarding your invoice.   IF you received labwork today, you will receive an invoice from [facility]. Please contact [facility] at [phone] with questions or concerns regarding your invoice.   Our billing staff will not be able to assist you with questions regarding bills from these companies.  You will be contacted with the lab results as soon as they are available. The fastest way to get your results is to activate your My Chart account. Instructions are located on the last page of this paperwork. If you have not heard from us regarding the results in 2 weeks, please contact this office.      

## 2015-12-09 NOTE — Progress Notes (Signed)
[facility] [address] [place] [address] [phone]  By signing my name below I, [name], attest that this documentation has been prepared under the direction and in the presence of Ivar Drape PA. Electonically Signed. [name], Scribe 12/09/2015 at 5:05 PM  Date:  12/09/2015   Name:  Dwayne [name]   DOB:  06/24/1975   MRN:  [id number]  PCP:  Donnie Coffin, MD    History of Present Illness:  Dwayne [name] is a 40 y.o. 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 Pt denies any complaints and concerns today. States he is overall healthy. Pt is smoker but states he has not had a cigarette in a week. He takes ibuprofen frequently for aches and pain.    Patient Active Problem List   Diagnosis Date Noted   Abnormal CT scan, chest    Sarcoidosis ([place]) 07/30/2014   Hepatosplenomegaly 07/30/2014   Lymphadenopathy 07/30/2014    Past Medical History:  Diagnosis Date   Allergic rhinitis     Past Surgical History:  Procedure Laterality Date   FLEXIBLE BRONCHOSCOPY N/A 09/25/2014   Procedure: FLEXIBLE BRONCHOSCOPY;  Surgeon: Gaye Pollack, MD;  Location: [place];  Service: Thoracic;  Laterality: N/A;   MEDIASTINOSCOPY N/A 09/25/2014   Procedure: MEDIASTINOSCOPY;  Surgeon: Gaye Pollack, MD;  Location: [place];  Service: Thoracic;  Laterality: N/A;   VASECTOMY     2002   VIDEO BRONCHOSCOPY Bilateral 08/13/2014   Procedure: VIDEO BRONCHOSCOPY WITH FLUORO;  Surgeon: Juanito Doom, MD;  Location: [place];  Service: Cardiopulmonary;  Laterality: Bilateral;    Social History  Substance Use Topics   Smoking status: Former Smoker    Packs/day: 0.30    Years: 20.00    Types: Cigarettes    Quit date: 08/05/2014   Smokeless tobacco: Never Used     Comment: Still occas smokes black and milds 07/27/15   Alcohol use 0.0 oz/week    Family History  Problem Relation Age of Onset   COPD Mother     No Known  Allergies  Medication list has been reviewed and updated.  Current Outpatient Prescriptions on File Prior to Visit  Medication Sig Dispense Refill   loratadine (CLARITIN) 10 MG tablet Take 10 mg by mouth daily.     No current facility-administered medications on file prior to visit.     Review of Systems  Constitutional: Negative for chills and fever.  HENT: Negative for ear discharge, ear pain and sore throat.   Eyes: Negative for blurred vision and double vision.  Respiratory: Negative for cough, shortness of breath and wheezing.   Cardiovascular: Negative for chest pain, palpitations and leg swelling.  Gastrointestinal: Negative for diarrhea, nausea and vomiting.  Genitourinary: Negative for dysuria, frequency and hematuria.  Skin: Negative for itching and rash.  Neurological: Negative for dizziness and headaches.   ROS unremarkable unless otherwise specified.  Physical Examination: BP 124/80 (BP Location: Right Arm, Patient Position: Sitting, Cuff Size: Large)    Pulse 79    Temp 98.4 F (36.9 C)    Resp 16    Ht 5' 8.5" (1.74 m)    Wt 221 lb 12.8 oz (100.6 kg)    SpO2 98%    BMI 33.23 kg/m  Ideal Body Weight: @FLOWAMB [id number]  Physical Exam  Constitutional: He is oriented to person, place, and time. He appears well-developed and well-nourished. No distress.  HENT:  Head: Normocephalic and atraumatic.  Right Ear:  Hearing, tympanic membrane, external ear and ear canal normal.  Left Ear: Hearing, tympanic membrane, external ear and ear canal normal.  Nose: Right sinus exhibits no maxillary sinus tenderness and no frontal sinus tenderness. Left sinus exhibits no maxillary sinus tenderness and no frontal sinus tenderness.  Mouth/Throat: Uvula is midline and oropharynx is clear and moist. No uvula swelling. No oropharyngeal exudate, posterior oropharyngeal edema or posterior oropharyngeal erythema.  Eyes: Conjunctivae and EOM are normal. Pupils are equal, round, and  reactive to light.  Neck: Normal range of motion. Neck supple. No thyromegaly present.  Cardiovascular: Normal rate, regular rhythm, normal heart sounds and intact distal pulses.  Exam reveals no gallop and no friction rub.   No murmur heard. Pulmonary/Chest: Effort normal. No respiratory distress. He has no decreased breath sounds. He has no wheezes. He has no rhonchi. He has no rales.  Abdominal: Soft. Bowel sounds are normal. He exhibits no distension and no mass. There is no tenderness.  Musculoskeletal: Normal range of motion. He exhibits no edema or deformity.  Lymphadenopathy:       Head (right side): No submandibular, no tonsillar, no preauricular and no posterior auricular adenopathy present.       Head (left side): No submandibular, no tonsillar, no preauricular and no posterior auricular adenopathy present.  Neurological: He is alert and oriented to person, place, and time. No cranial nerve deficit. He exhibits normal muscle tone. Coordination normal.  Skin: Skin is warm and dry. He is not diaphoretic.  Psychiatric: He has a normal mood and affect. His behavior is normal.  Nursing note reviewed.   Visual Acuity Screening   Right eye Left eye Both eyes  Without correction: 20/20 20/20 20/20   With correction:     Comments: Peripheral Vision: Right eye 85 degrees. Left eye 85 degrees.  Hearing Screening Comments: Whisper 10 ft passed Color test 8/8   Assessment and Plan: Dwayne [name] is a 40 y.o. 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 Normal with 2 year licensure given.  Encounter for examination required by Department of Transportation (DOT)  Ivar Drape, PA-C [facility] 12/09/2015 5:05 PM

## 2016-01-12 IMAGING — CR DG CHEST 1V PORT
1 series · 1 of 1 positions shown · non-contrast
Comparison: Chest CT 08/04/2014.

CLINICAL DATA: Status post bronchoscopy with left lung biopsy.

EXAM:
PORTABLE CHEST - 1 VIEW

[AP]
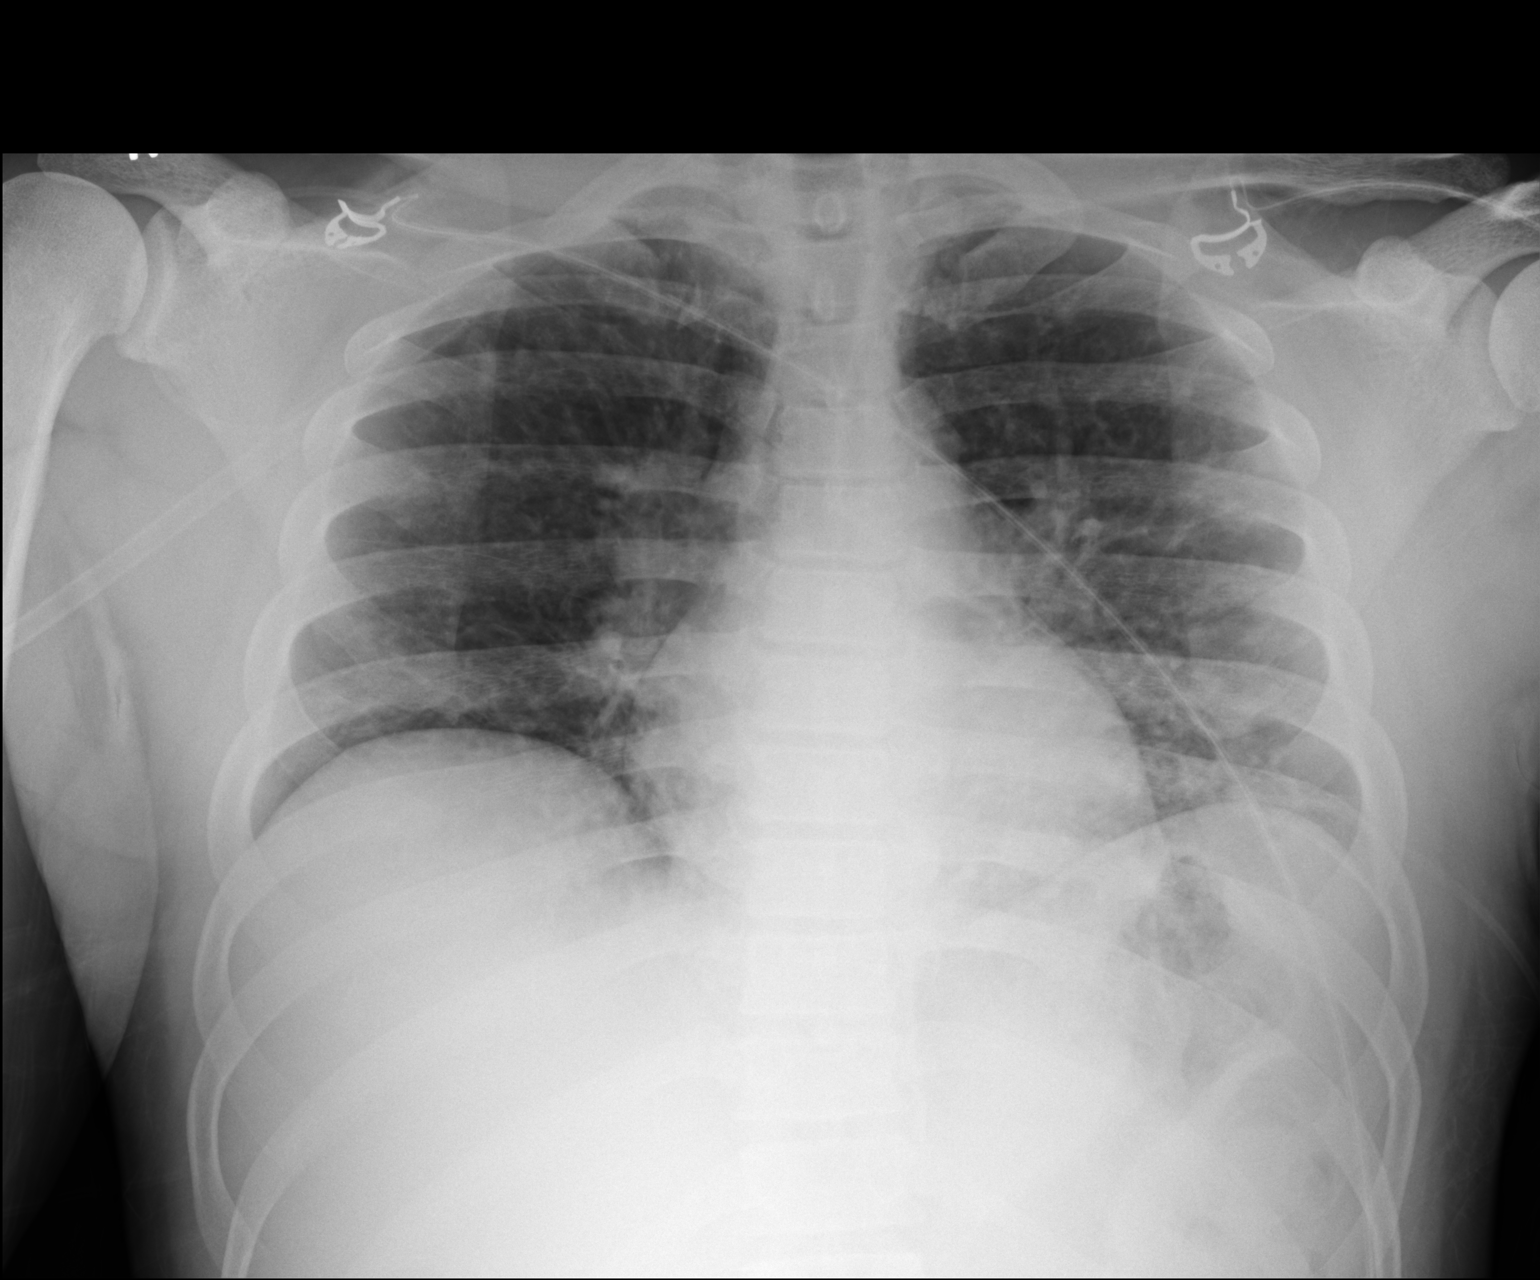

[1 of 1 positions shown; findings below may reference images not displayed]

FINDINGS: Cardiac silhouette is within normal limits for size. Slight fullness
of the hilar regions may reflect lymphadenopathy as suspected on
recent chest CT. The lungs are hypoinflated with mildly nodular
interstitial densities bilaterally as well as more patchy opacity in
the left lung base. No pleural effusion or pneumothorax is
identified no acute osseous abnormality is seen.
IMPRESSION: 1. Hypoinflation with bilateral interstitial densities and patchy
left basilar opacities, overall likely not significantly changed
from recent CT where the appearance was suggestive of sarcoidosis.
2. No pneumothorax.

## 2016-12-25 IMAGING — DX DG CHEST 2V
2 series · 2 of 2 positions shown · non-contrast
Comparison: 01/19/2015

CLINICAL DATA: Sarcoidosis, smoker, followup

EXAM:
CHEST  2 VIEW

[chest pa]
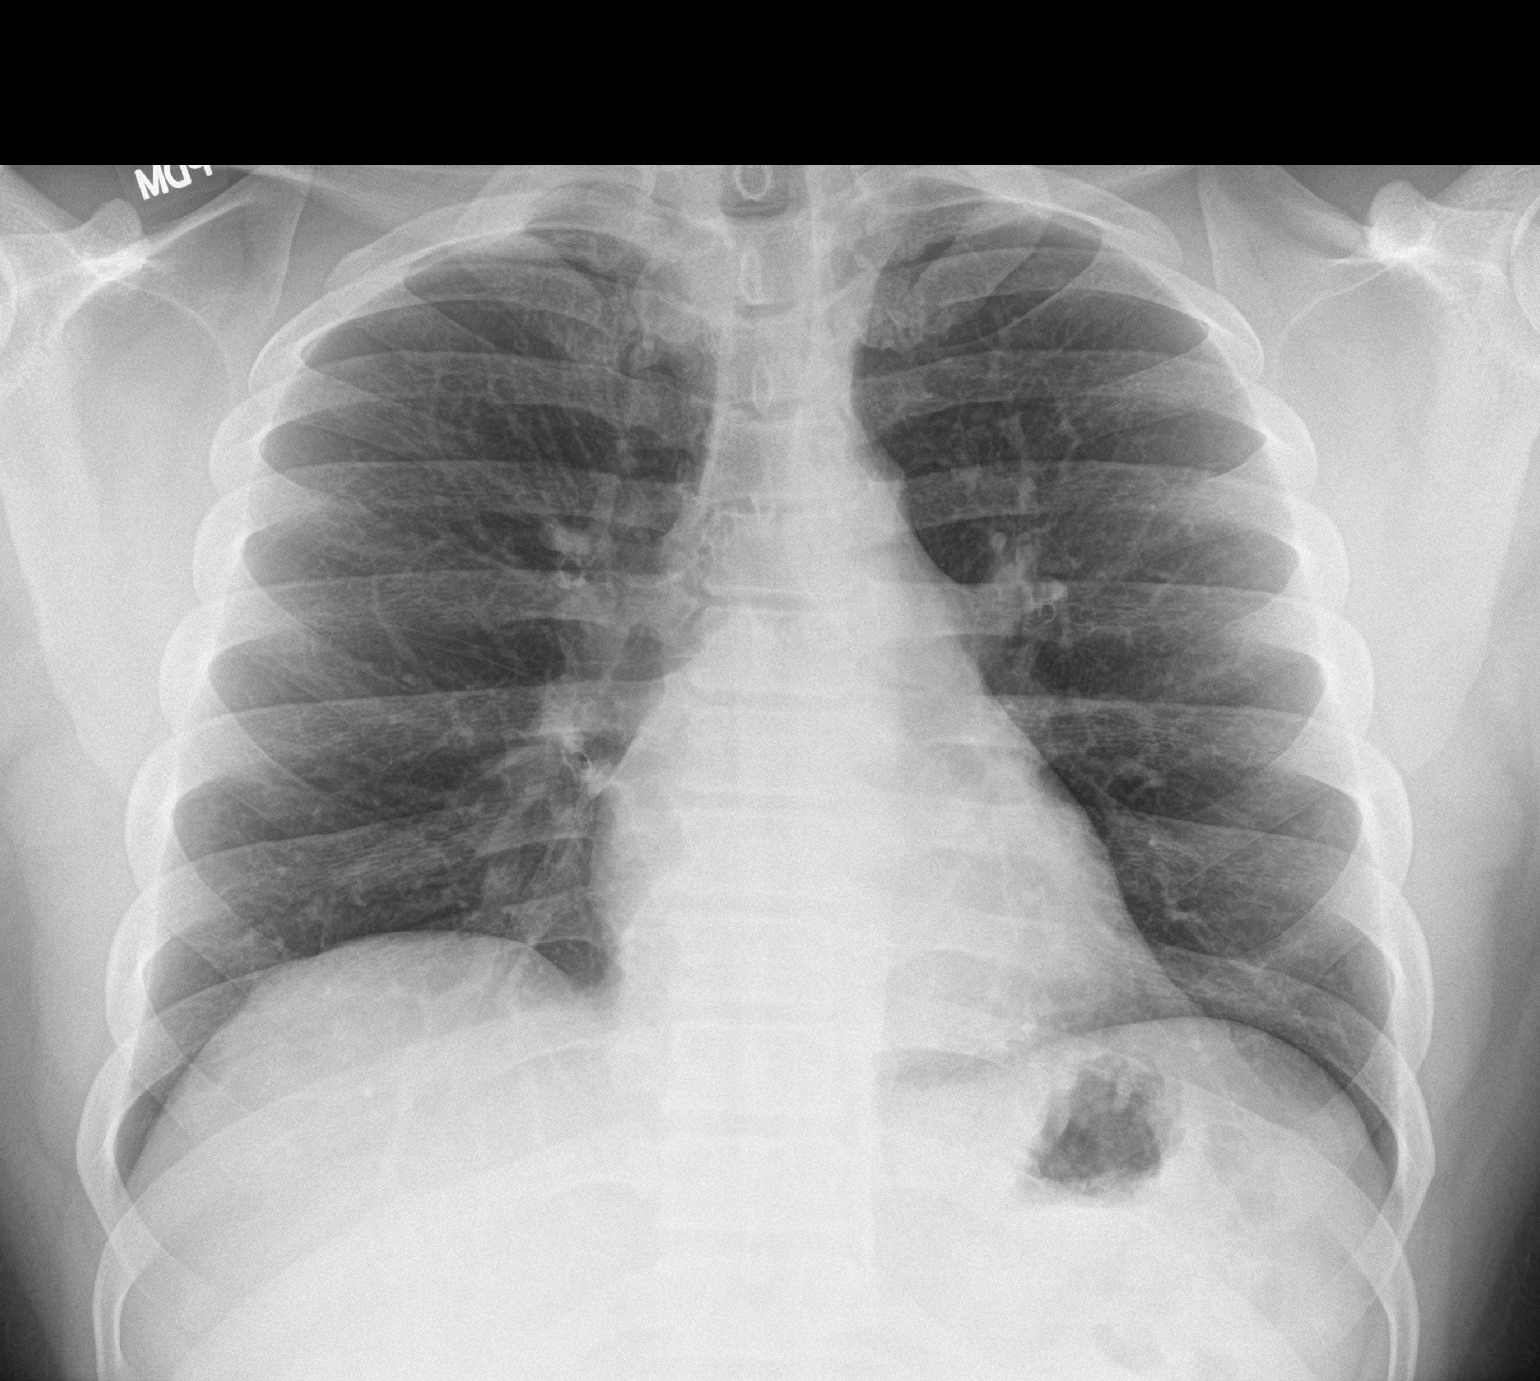

[chest lat]
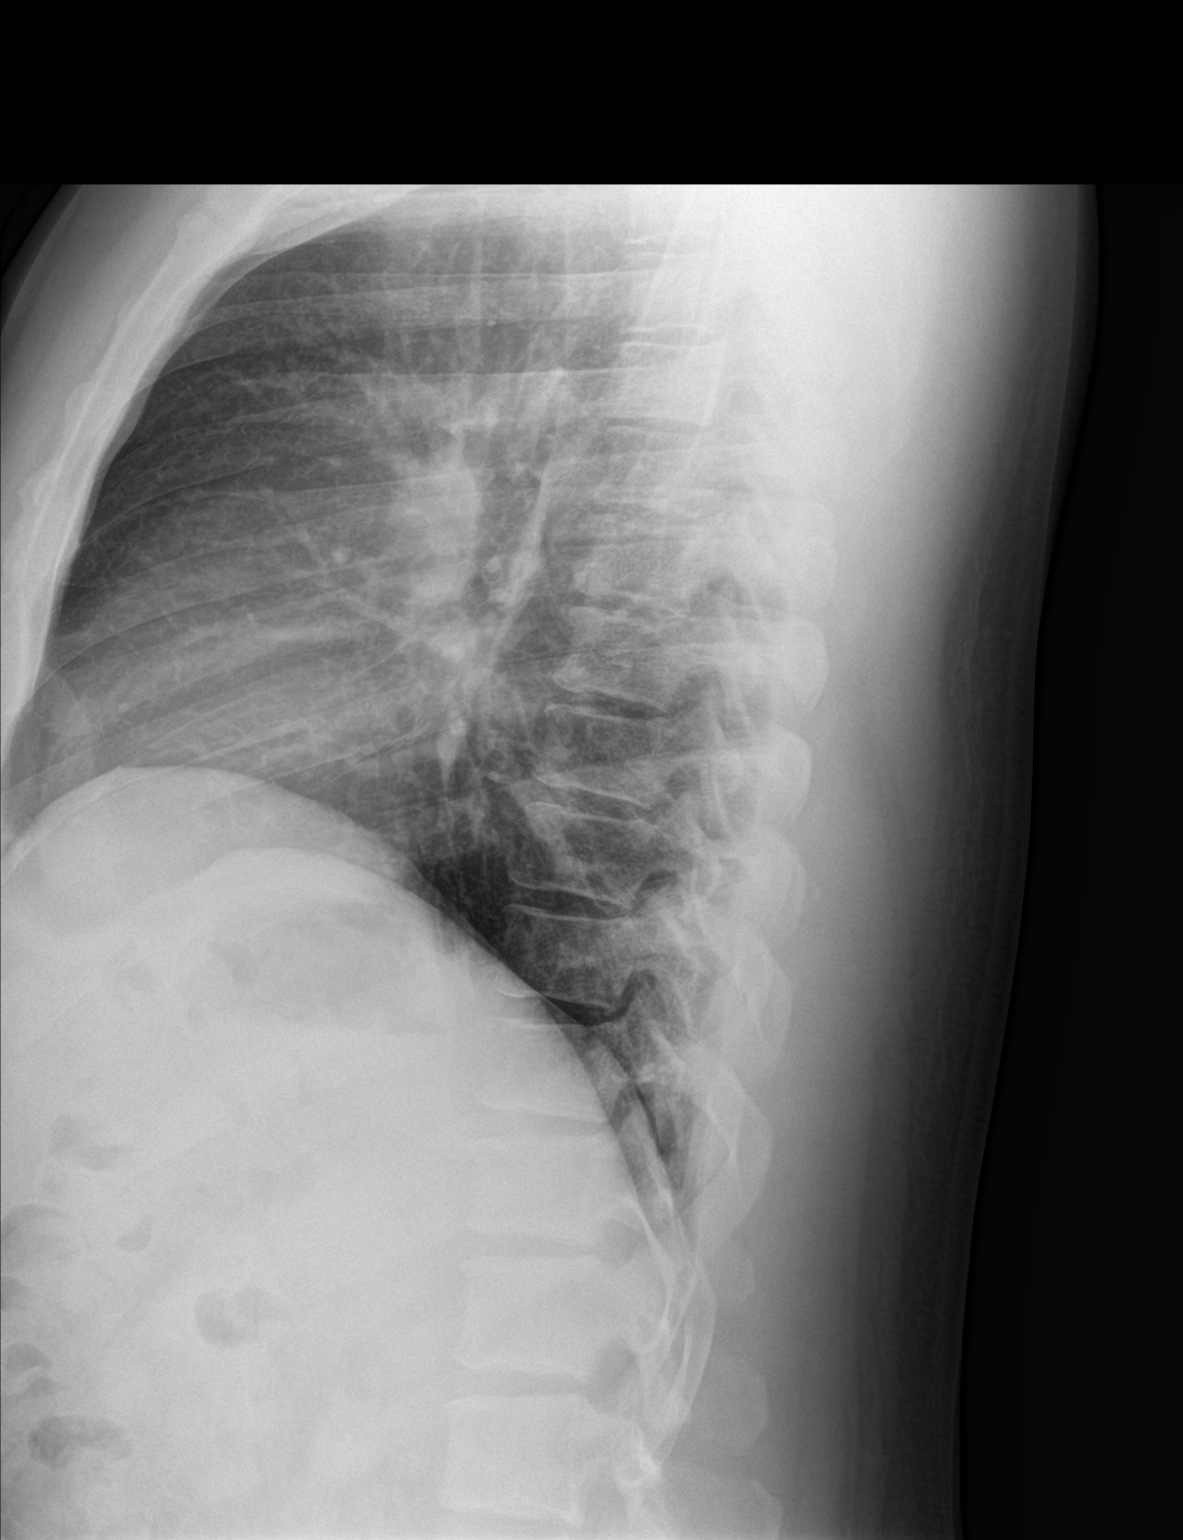

[2 of 2 positions shown; findings below may reference images not displayed]

FINDINGS: Normal heart size, mediastinal contours, and pulmonary vascularity.

Mild chronic bronchitic changes.

Lungs otherwise clear.

No pleural effusion or pneumothorax.

No acute bony abnormalities.
IMPRESSION: Chronic bronchitic changes without infiltrate.

## 2018-11-16 DIAGNOSIS — Z20828 Contact with and (suspected) exposure to other viral communicable diseases: Secondary | ICD-10-CM | POA: Diagnosis not present

## 2019-03-01 DIAGNOSIS — Z20828 Contact with and (suspected) exposure to other viral communicable diseases: Secondary | ICD-10-CM | POA: Diagnosis not present

## 2019-03-10 DIAGNOSIS — Z20828 Contact with and (suspected) exposure to other viral communicable diseases: Secondary | ICD-10-CM | POA: Diagnosis not present

## 2019-03-10 DIAGNOSIS — B349 Viral infection, unspecified: Secondary | ICD-10-CM | POA: Diagnosis not present

## 2019-03-16 DIAGNOSIS — Z20828 Contact with and (suspected) exposure to other viral communicable diseases: Secondary | ICD-10-CM | POA: Diagnosis not present

## 2020-03-12 DIAGNOSIS — J343 Hypertrophy of nasal turbinates: Secondary | ICD-10-CM | POA: Diagnosis not present

## 2020-03-12 DIAGNOSIS — J342 Deviated nasal septum: Secondary | ICD-10-CM | POA: Diagnosis not present

## 2020-03-12 DIAGNOSIS — J3489 Other specified disorders of nose and nasal sinuses: Secondary | ICD-10-CM | POA: Diagnosis not present

## 2020-03-12 DIAGNOSIS — K219 Gastro-esophageal reflux disease without esophagitis: Secondary | ICD-10-CM | POA: Diagnosis not present

## 2020-05-13 ENCOUNTER — Institutional Professional Consult (permissible substitution): Payer: BLUE CROSS/BLUE SHIELD | Admitting: Pulmonary Disease

## 2020-06-17 ENCOUNTER — Other Ambulatory Visit: Payer: Self-pay

## 2020-06-17 ENCOUNTER — Ambulatory Visit (INDEPENDENT_AMBULATORY_CARE_PROVIDER_SITE_OTHER): Payer: BC Managed Care – PPO | Admitting: Pulmonary Disease

## 2020-06-17 ENCOUNTER — Encounter: Payer: Self-pay | Admitting: Pulmonary Disease

## 2020-06-17 VITALS — BP 124/82 | HR 90 | Temp 98.3°F | Ht 69.0 in | Wt 240.0 lb

## 2020-06-17 DIAGNOSIS — G4733 Obstructive sleep apnea (adult) (pediatric): Secondary | ICD-10-CM

## 2020-06-17 NOTE — Patient Instructions (Signed)
Moderate probability of significant obstructive sleep apnea -We will schedule you for home sleep study -We will update your results and symptoms reviewed  Treatment options as discussed  Call with significant concerns Sleep Apnea Sleep apnea affects breathing during sleep. It causes breathing to stop for a short time or to become shallow. It can also increase the risk of:  Heart attack.  Stroke.  Being very overweight (obese).  Diabetes.  Heart failure.  Irregular heartbeat. The goal of treatment is to help you breathe normally again. What are the causes? There are three kinds of sleep apnea:  Obstructive sleep apnea. This is caused by a blocked or collapsed airway.  Central sleep apnea. This happens when the brain does not send the right signals to the muscles that control breathing.  Mixed sleep apnea. This is a combination of obstructive and central sleep apnea. The most common cause of this condition is a collapsed or blocked airway. This can happen if:  Your throat muscles are too relaxed.  Your tongue and tonsils are too large.  You are overweight.  Your airway is too small.   What increases the risk?  Being overweight.  Smoking.  Having a small airway.  Being older.  Being male.  Drinking alcohol.  Taking medicines to calm yourself (sedatives or tranquilizers).  Having family members with the condition. What are the signs or symptoms?  Trouble staying asleep.  Being sleepy or tired during the day.  Getting angry a lot.  Loud snoring.  Headaches in the morning.  Not being able to focus your mind (concentrate).  Forgetting things.  Less interest in sex.  Mood swings.  Personality changes.  Feelings of sadness (depression).  Waking up a lot during the night to pee (urinate).  Dry mouth.  Sore throat. How is this diagnosed?  Your medical history.  A physical exam.  A test that is done when you are sleeping (sleep study).  The test is most often done in a sleep lab but may also be done at home. How is this treated?  Sleeping on your side.  Using a medicine to get rid of mucus in your nose (decongestant).  Avoiding the use of alcohol, medicines to help you relax, or certain pain medicines (narcotics).  Losing weight, if needed.  Changing your diet.  Not smoking.  Using a machine to open your airway while you sleep, such as: ? An oral appliance. This is a mouthpiece that shifts your lower jaw forward. ? A CPAP device. This device blows air through a mask when you breathe out (exhale). ? An EPAP device. This has valves that you put in each nostril. ? A BPAP device. This device blows air through a mask when you breathe in (inhale) and breathe out.  Having surgery if other treatments do not work. It is important to get treatment for sleep apnea. Without treatment, it can lead to:  High blood pressure.  Coronary artery disease.  In men, not being able to have an erection (impotence).  Reduced thinking ability.   Follow these instructions at home: Lifestyle  Make changes that your doctor recommends.  Eat a healthy diet.  Lose weight if needed.  Avoid alcohol, medicines to help you relax, and some pain medicines.  Do not use any products that contain nicotine or tobacco, such as cigarettes, e-cigarettes, and chewing tobacco. If you need help quitting, ask your doctor. General instructions  Take over-the-counter and prescription medicines only as told by your doctor.  If you were given a machine to use while you sleep, use it only as told by your doctor.  If you are having surgery, make sure to tell your doctor you have sleep apnea. You may need to bring your device with you.  Keep all follow-up visits as told by your doctor. This is important. Contact a doctor if:  The machine that you were given to use during sleep bothers you or does not seem to be working.  You do not get  better.  You get worse. Get help right away if:  Your chest hurts.  You have trouble breathing in enough air.  You have an uncomfortable feeling in your back, arms, or stomach.  You have trouble talking.  One side of your body feels weak.  A part of your face is hanging down. These symptoms may be an emergency. Do not wait to see if the symptoms will go away. Get medical help right away. Call your local emergency services (911 in the [place] Do not drive yourself to the hospital. Summary  This condition affects breathing during sleep.  The most common cause is a collapsed or blocked airway.  The goal of treatment is to help you breathe normally while you sleep. This information is not intended to replace advice given to you by your health care provider. Make sure you discuss any questions you have with your health care provider. Document Revised: 01/12/2018 Document Reviewed: 11/21/2017 Elsevier Patient Education  [place].

## 2020-06-17 NOTE — Progress Notes (Signed)
Dwayne [name]    [id number]    12/06/1975  Primary Care Physician:Mitchell, L.Marlou Sa, MD  Referring Physician: Alroy Dust, Carlean Jews.Marlou Sa, [place] [employer] [address] [place],  [place] [address]  Chief complaint:   Patient being seen for possible obstructive sleep apnea   HPI:  History of witnessed apnea, snoring Usually goes to bed between 10 9:11 PM Takes him about 30 minutes to an hour to fall asleep 3-4 awakenings Final wake up time about 4:30 in the morning  Weight is up about 15 pounds up  Admits to occasional dryness of his throat in the mornings No morning headaches Memory is fine Focus is good  No family history of obstructive sleep apnea  He says he is more concerned about his tonsils  Does not really think he has sleep disordered breathing  States he wakes up feeling like is at a good nights rest on most days for the number of hours of sleep he gets  He smokes cigars  Past history of sarcoidosis for which he was treated with steroids for about 3 years Outpatient Encounter Medications as of 06/17/2020  Medication Sig  . [DISCONTINUED] loratadine (CLARITIN) 10 MG tablet Take 10 mg by mouth daily. (Patient not taking: Reported on 06/17/2020)   No facility-administered encounter medications on file as of 06/17/2020.    Allergies as of 06/17/2020  . (No Known Allergies)    Past Medical History:  Diagnosis Date  . Allergic rhinitis     Past Surgical History:  Procedure Laterality Date  . FLEXIBLE BRONCHOSCOPY N/A 09/25/2014   Procedure: FLEXIBLE BRONCHOSCOPY;  Surgeon: Gaye Pollack, MD;  Location: [facility];  Service: Thoracic;  Laterality: N/A;  . MEDIASTINOSCOPY N/A 09/25/2014   Procedure: MEDIASTINOSCOPY;  Surgeon: Gaye Pollack, MD;  Location: [facility];  Service: Thoracic;  Laterality: N/A;  . VASECTOMY     2002  . VIDEO BRONCHOSCOPY Bilateral 08/13/2014   Procedure: VIDEO BRONCHOSCOPY WITH FLUORO;  Surgeon: Juanito Doom, MD;  Location: [facility];  Service: Cardiopulmonary;  Laterality: Bilateral;    Family History  Problem Relation Age of Onset  . COPD Mother     Social History   Socioeconomic History  . Marital status: Married    Spouse name: Not on file  . Number of children: Not on file  . Years of education: Not on file  . Highest education level: Not on file  Occupational History  . Not on file  Tobacco Use  . Smoking status: Former Smoker    Packs/day: 0.30    Years: 20.00    Pack years: 6.00    Types: Cigarettes    Quit date: 08/05/2014    Years since quitting: 5.8  . Smokeless tobacco: Never Used  . Tobacco comment: Still occas smokes black and milds 07/27/15  Substance and Sexual Activity  . Alcohol use: Yes    Alcohol/week: 0.0 standard drinks  . Drug use: No  . Sexual activity: Not on file  Other Topics Concern  . Not on file  Social History Narrative  . Not on file   Social Determinants of Health   Financial Resource Strain: Not on file  Food Insecurity: Not on file  Transportation Needs: Not on file  Physical Activity: Not on file  Stress: Not on file  Social Connections: Not on file  Intimate Partner Violence: Not on file    Review of Systems  Constitutional: Negative for fatigue.  Respiratory: Negative for  shortness of breath.   Psychiatric/Behavioral: Positive for sleep disturbance.    Vitals:   06/17/20 1343  BP: 124/82  Pulse: 90  Temp: 98.3 F (36.8 C)  SpO2: 96%     Physical Exam Constitutional:      Appearance: He is obese.  HENT:     Head: Normocephalic.     Nose: Nose normal. No congestion.     Mouth/Throat:     Mouth: Mucous membranes are moist.     Comments: Mallampati 3, crowded oropharynx Cardiovascular:     Rate and Rhythm: Normal rate and regular rhythm.     Heart sounds: No murmur heard. No friction rub.  Pulmonary:     Effort: No respiratory distress.     Breath sounds: No stridor. No wheezing or rhonchi.  Musculoskeletal:     Cervical  back: No rigidity or tenderness.  Neurological:     Mental Status: He is alert.  Psychiatric:        Mood and Affect: Mood normal.    Results of the Epworth flowsheet 06/17/2020  Sitting and reading 3  Watching TV 1  Sitting, inactive in a public place (e.g. a theatre or a meeting) 3  As a passenger in a car for an hour without a break 3  Lying down to rest in the afternoon when circumstances permit 0  Sitting and talking to someone 1  Sitting quietly after a lunch without alcohol 2  In a car, while stopped for a few minutes in traffic 1  Total score 14    Assessment:  Excessive daytime sleepiness  Moderate probability of significant obstructive sleep apnea  Pathophysiology of sleep disordered breathing discussed with the patient Treatment options for sleep disordered breathing discussed with the patient  Plan/Recommendations: We will schedule patient for home sleep study  Updated with results as soon as reviewed  Encourage weight loss efforts  Tentative follow-up in about 3 to 4 months   Sherrilyn Rist MD [facility] 06/17/2020, 2:08 PM  CC: Alroy Dust, L.Marlou Sa, MD

## 2020-06-30 ENCOUNTER — Ambulatory Visit: Payer: BC Managed Care – PPO

## 2020-06-30 ENCOUNTER — Other Ambulatory Visit: Payer: Self-pay

## 2020-06-30 DIAGNOSIS — G4733 Obstructive sleep apnea (adult) (pediatric): Secondary | ICD-10-CM | POA: Diagnosis not present

## 2020-07-09 ENCOUNTER — Telehealth: Payer: Self-pay | Admitting: Pulmonary Disease

## 2020-07-09 ENCOUNTER — Encounter: Payer: Self-pay | Admitting: *Deleted

## 2020-07-09 DIAGNOSIS — G4733 Obstructive sleep apnea (adult) (pediatric): Secondary | ICD-10-CM

## 2020-07-09 NOTE — Telephone Encounter (Signed)
ATC patient unable to reach LM to call back office (x1)  

## 2020-07-09 NOTE — Telephone Encounter (Signed)
Call patient  Sleep study result  Date of study: 07/01/2020  Impression: Moderate obstructive sleep apnea Moderate oxygen desaturations  Recommendation: DME referral  Recommend CPAP therapy for moderate obstructive sleep apnea  Auto titrating CPAP with pressure settings of 5-15 will be appropriate  Encourage weight loss measures  Follow-up in the office 4 to 6 weeks following initiation of treatment

## 2020-07-13 NOTE — Telephone Encounter (Signed)
ATC patient to go over sleep study results. Per DPR left detailed message with results and inormed patient to call the office back if he would like to proceed with getting set up with CPAP machine. Orders can be placed once we hear back from patient. Nothing further needed at this time.

## 2020-07-15 ENCOUNTER — Telehealth: Payer: Self-pay | Admitting: Pulmonary Disease

## 2020-07-15 DIAGNOSIS — G4733 Obstructive sleep apnea (adult) (pediatric): Secondary | ICD-10-CM

## 2020-07-15 NOTE — Telephone Encounter (Signed)
Called and spoke with Patient Wife, [name] (DPR). [name] stated Patient is ready to start cpap. DME order placed. [name] aware of follow up after cpap received 30-90 days, and cpap delay. Nothing further at this time.

## 2020-08-02 DIAGNOSIS — G4733 Obstructive sleep apnea (adult) (pediatric): Secondary | ICD-10-CM | POA: Diagnosis not present

## 2020-11-30 ENCOUNTER — Telehealth: Payer: Self-pay | Admitting: Pulmonary Disease

## 2020-11-30 NOTE — Telephone Encounter (Signed)
Spoke to patient, who is requesting a copy of sleep study. 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 Patient would like to pickup tomorrow.  Triage, please place up front for pickup. Thanks

## 2020-11-30 NOTE — Telephone Encounter (Signed)
Sleep study printed out and put in envelope. Will placed up front for patient to pick up Nothing further needed at this time

## 2020-12-08 ENCOUNTER — Ambulatory Visit: Payer: BC Managed Care – PPO | Admitting: Pulmonary Disease

## 2021-03-29 DIAGNOSIS — Z1159 Encounter for screening for other viral diseases: Secondary | ICD-10-CM | POA: Diagnosis not present

## 2021-03-29 DIAGNOSIS — U071 COVID-19: Secondary | ICD-10-CM | POA: Diagnosis not present
